# Patient Record
Sex: Female | Born: 1955 | Race: Black or African American | Hispanic: No | Marital: Single | Smoking: Never smoker
Health system: Southern US, Community
[De-identification: ages and names within clinical notes are randomized; demographics above are authoritative.]

## PROBLEM LIST (undated history)

## (undated) DIAGNOSIS — Z9889 Other specified postprocedural states: Secondary | ICD-10-CM

## (undated) DIAGNOSIS — T7840XA Allergy, unspecified, initial encounter: Secondary | ICD-10-CM

## (undated) DIAGNOSIS — R112 Nausea with vomiting, unspecified: Secondary | ICD-10-CM

## (undated) DIAGNOSIS — D259 Leiomyoma of uterus, unspecified: Secondary | ICD-10-CM

## (undated) DIAGNOSIS — M719 Bursopathy, unspecified: Secondary | ICD-10-CM

## (undated) DIAGNOSIS — M1990 Unspecified osteoarthritis, unspecified site: Secondary | ICD-10-CM

## (undated) DIAGNOSIS — E785 Hyperlipidemia, unspecified: Secondary | ICD-10-CM

## (undated) DIAGNOSIS — I10 Essential (primary) hypertension: Secondary | ICD-10-CM

## (undated) HISTORY — PX: BREAST CYST EXCISION: SHX579

## (undated) HISTORY — DX: Other specified postprocedural states: Z98.890

## (undated) HISTORY — DX: Essential (primary) hypertension: I10

## (undated) HISTORY — DX: Unspecified osteoarthritis, unspecified site: M19.90

## (undated) HISTORY — DX: Allergy, unspecified, initial encounter: T78.40XA

## (undated) HISTORY — DX: Nausea with vomiting, unspecified: R11.2

## (undated) HISTORY — DX: Hyperlipidemia, unspecified: E78.5

## (undated) HISTORY — DX: Leiomyoma of uterus, unspecified: D25.9

---

## 1997-07-19 ENCOUNTER — Ambulatory Visit (HOSPITAL_COMMUNITY): Admission: RE | Admit: 1997-07-19 | Discharge: 1997-07-19 | Payer: Self-pay | Admitting: Obstetrics and Gynecology

## 1997-08-08 ENCOUNTER — Ambulatory Visit (HOSPITAL_COMMUNITY): Admission: RE | Admit: 1997-08-08 | Discharge: 1997-08-08 | Payer: Self-pay | Admitting: Obstetrics and Gynecology

## 1998-09-19 ENCOUNTER — Other Ambulatory Visit: Admission: RE | Admit: 1998-09-19 | Discharge: 1998-09-19 | Payer: Self-pay | Admitting: Obstetrics and Gynecology

## 1998-10-31 ENCOUNTER — Encounter: Payer: Self-pay | Admitting: Obstetrics and Gynecology

## 1998-10-31 ENCOUNTER — Ambulatory Visit (HOSPITAL_COMMUNITY): Admission: RE | Admit: 1998-10-31 | Discharge: 1998-10-31 | Payer: Self-pay | Admitting: Obstetrics and Gynecology

## 2000-01-06 HISTORY — PX: KNEE SURGERY: SHX244

## 2000-03-18 ENCOUNTER — Ambulatory Visit (HOSPITAL_COMMUNITY): Admission: RE | Admit: 2000-03-18 | Discharge: 2000-03-18 | Payer: Self-pay | Admitting: Obstetrics and Gynecology

## 2000-03-18 ENCOUNTER — Encounter: Payer: Self-pay | Admitting: Obstetrics and Gynecology

## 2000-04-29 ENCOUNTER — Other Ambulatory Visit: Admission: RE | Admit: 2000-04-29 | Discharge: 2000-04-29 | Payer: Self-pay | Admitting: Obstetrics and Gynecology

## 2000-06-29 ENCOUNTER — Encounter: Payer: Self-pay | Admitting: Obstetrics and Gynecology

## 2000-06-29 ENCOUNTER — Ambulatory Visit (HOSPITAL_COMMUNITY): Admission: RE | Admit: 2000-06-29 | Discharge: 2000-06-29 | Payer: Self-pay | Admitting: Obstetrics and Gynecology

## 2001-04-27 ENCOUNTER — Encounter: Payer: Self-pay | Admitting: Obstetrics and Gynecology

## 2001-04-27 ENCOUNTER — Ambulatory Visit (HOSPITAL_COMMUNITY): Admission: RE | Admit: 2001-04-27 | Discharge: 2001-04-27 | Payer: Self-pay | Admitting: Obstetrics and Gynecology

## 2002-05-24 ENCOUNTER — Ambulatory Visit (HOSPITAL_COMMUNITY): Admission: RE | Admit: 2002-05-24 | Discharge: 2002-05-24 | Payer: Self-pay | Admitting: Obstetrics and Gynecology

## 2002-05-24 ENCOUNTER — Encounter: Payer: Self-pay | Admitting: Obstetrics and Gynecology

## 2003-01-06 HISTORY — PX: EYE SURGERY: SHX253

## 2003-06-05 ENCOUNTER — Ambulatory Visit (HOSPITAL_COMMUNITY): Admission: RE | Admit: 2003-06-05 | Discharge: 2003-06-05 | Payer: Self-pay | Admitting: Obstetrics and Gynecology

## 2003-12-25 ENCOUNTER — Ambulatory Visit: Payer: Self-pay | Admitting: Family Medicine

## 2004-05-07 ENCOUNTER — Ambulatory Visit: Payer: Self-pay | Admitting: Family Medicine

## 2004-06-17 ENCOUNTER — Ambulatory Visit (HOSPITAL_COMMUNITY): Admission: RE | Admit: 2004-06-17 | Discharge: 2004-06-17 | Payer: Self-pay | Admitting: Obstetrics and Gynecology

## 2004-09-05 ENCOUNTER — Ambulatory Visit: Payer: Self-pay | Admitting: Family Medicine

## 2005-06-18 ENCOUNTER — Ambulatory Visit (HOSPITAL_COMMUNITY): Admission: RE | Admit: 2005-06-18 | Discharge: 2005-06-18 | Payer: Self-pay | Admitting: Obstetrics and Gynecology

## 2005-10-14 ENCOUNTER — Ambulatory Visit: Payer: Self-pay | Admitting: Family Medicine

## 2005-10-22 ENCOUNTER — Ambulatory Visit: Payer: Self-pay | Admitting: Family Medicine

## 2005-10-22 LAB — CONVERTED CEMR LAB
AST: 23 units/L (ref 0–37)
BUN: 11 mg/dL (ref 6–23)
Basophils Absolute: 0 10*3/uL (ref 0.0–0.1)
Basophils Relative: 0.5 % (ref 0.0–1.0)
Calcium: 9 mg/dL (ref 8.4–10.5)
Chloride: 108 meq/L (ref 96–112)
Cholesterol: 175 mg/dL (ref 0–200)
Creatinine, Ser: 0.8 mg/dL (ref 0.4–1.2)
GFR calc non Af Amer: 81 mL/min
Glomerular Filtration Rate, Af Am: 98 mL/min/{1.73_m2}
HDL: 46.8 mg/dL (ref >39.0)
Monocytes Relative: 6.3 % (ref 3.0–11.0)
Platelets: 287 10*3/uL (ref 150–400)
Potassium: 3.5 meq/L (ref 3.5–5.1)
RBC: 4.79 M/uL (ref 3.87–5.11)
RDW: 12.6 % (ref 11.5–14.6)
TSH: 1.59 microintl units/mL (ref 0.35–5.50)
Total Protein: 6.9 g/dL (ref 6.0–8.3)
WBC: 7 10*3/uL (ref 4.5–10.5)

## 2005-11-10 ENCOUNTER — Ambulatory Visit: Payer: Self-pay

## 2005-11-16 ENCOUNTER — Ambulatory Visit: Payer: Self-pay | Admitting: Family Medicine

## 2006-03-24 ENCOUNTER — Ambulatory Visit: Payer: Self-pay | Admitting: Family Medicine

## 2006-08-30 ENCOUNTER — Telehealth (INDEPENDENT_AMBULATORY_CARE_PROVIDER_SITE_OTHER): Payer: Self-pay | Admitting: *Deleted

## 2007-03-17 ENCOUNTER — Ambulatory Visit: Payer: Self-pay | Admitting: Family Medicine

## 2007-03-17 DIAGNOSIS — J309 Allergic rhinitis, unspecified: Secondary | ICD-10-CM | POA: Insufficient documentation

## 2007-03-17 DIAGNOSIS — I10 Essential (primary) hypertension: Secondary | ICD-10-CM

## 2007-03-17 LAB — CONVERTED CEMR LAB
Bilirubin Urine: NEGATIVE
Blood in Urine, dipstick: NEGATIVE
Ketones, urine, test strip: NEGATIVE
Nitrite: NEGATIVE
Protein, U semiquant: NEGATIVE
Specific Gravity, Urine: 1.015
WBC Urine, dipstick: NEGATIVE

## 2007-03-22 ENCOUNTER — Encounter: Payer: Self-pay | Admitting: Family Medicine

## 2007-03-23 ENCOUNTER — Encounter (INDEPENDENT_AMBULATORY_CARE_PROVIDER_SITE_OTHER): Payer: Self-pay | Admitting: *Deleted

## 2007-03-23 LAB — CONVERTED CEMR LAB
AST: 21 units/L (ref 0–37)
BUN: 14 mg/dL (ref 6–23)
Basophils Relative: 1 % (ref 0.0–1.0)
Chloride: 105 meq/L (ref 96–112)
Eosinophils Absolute: 0.2 10*3/uL (ref 0.0–0.6)
Eosinophils Relative: 2.4 % (ref 0.0–5.0)
GFR calc non Af Amer: 95 mL/min
MCV: 87.5 fL (ref 78.0–100.0)
Monocytes Absolute: 0.5 10*3/uL (ref 0.2–0.7)
Neutrophils Relative %: 64.3 % (ref 43.0–77.0)
Platelets: 305 10*3/uL (ref 150–400)
Potassium: 3.8 meq/L (ref 3.5–5.1)
RBC: 4.86 M/uL (ref 3.87–5.11)
Sodium: 141 meq/L (ref 135–145)
TSH: 1.21 microintl units/mL (ref 0.35–5.50)
Total Bilirubin: 0.8 mg/dL (ref 0.3–1.2)

## 2007-03-30 ENCOUNTER — Telehealth (INDEPENDENT_AMBULATORY_CARE_PROVIDER_SITE_OTHER): Payer: Self-pay | Admitting: *Deleted

## 2007-04-11 ENCOUNTER — Telehealth (INDEPENDENT_AMBULATORY_CARE_PROVIDER_SITE_OTHER): Payer: Self-pay | Admitting: *Deleted

## 2007-08-05 ENCOUNTER — Ambulatory Visit: Payer: Self-pay | Admitting: Family Medicine

## 2007-08-05 DIAGNOSIS — E785 Hyperlipidemia, unspecified: Secondary | ICD-10-CM | POA: Insufficient documentation

## 2007-08-05 DIAGNOSIS — IMO0001 Reserved for inherently not codable concepts without codable children: Secondary | ICD-10-CM | POA: Insufficient documentation

## 2007-08-11 ENCOUNTER — Telehealth (INDEPENDENT_AMBULATORY_CARE_PROVIDER_SITE_OTHER): Payer: Self-pay | Admitting: *Deleted

## 2007-08-11 LAB — CONVERTED CEMR LAB
ALT: 32 units/L (ref 0–35)
AST: 28 units/L (ref 0–37)
Bilirubin, Direct: 0.1 mg/dL (ref 0.0–0.3)
Total Bilirubin: 1 mg/dL (ref 0.3–1.2)

## 2007-09-09 ENCOUNTER — Ambulatory Visit (HOSPITAL_COMMUNITY): Admission: RE | Admit: 2007-09-09 | Discharge: 2007-09-09 | Payer: Self-pay | Admitting: Obstetrics and Gynecology

## 2007-10-27 ENCOUNTER — Ambulatory Visit: Payer: Self-pay | Admitting: Family Medicine

## 2008-04-13 ENCOUNTER — Telehealth (INDEPENDENT_AMBULATORY_CARE_PROVIDER_SITE_OTHER): Payer: Self-pay | Admitting: *Deleted

## 2008-04-23 ENCOUNTER — Ambulatory Visit: Payer: Self-pay | Admitting: Family Medicine

## 2008-04-23 LAB — CONVERTED CEMR LAB
Glucose, Urine, Semiquant: NEGATIVE
Ketones, urine, test strip: NEGATIVE
Protein, U semiquant: NEGATIVE
Specific Gravity, Urine: 1.025
WBC Urine, dipstick: NEGATIVE

## 2008-04-24 ENCOUNTER — Encounter: Payer: Self-pay | Admitting: Family Medicine

## 2008-04-26 LAB — CONVERTED CEMR LAB
AST: 23 units/L (ref 0–37)
Albumin: 3.9 g/dL (ref 3.5–5.2)
Calcium: 9 mg/dL (ref 8.4–10.5)
Creatinine, Ser: 0.7 mg/dL (ref 0.4–1.2)
Eosinophils Relative: 5 % (ref 0.0–5.0)
Glucose, Bld: 86 mg/dL (ref 70–99)
Lymphs Abs: 2.2 10*3/uL (ref 0.7–4.0)
MCHC: 34 g/dL (ref 30.0–36.0)
Monocytes Absolute: 0.5 10*3/uL (ref 0.1–1.0)
Neutro Abs: 5.3 10*3/uL (ref 1.4–7.7)
Neutrophils Relative %: 62.2 % (ref 43.0–77.0)
RBC: 4.98 M/uL (ref 3.87–5.11)
RDW: 12.3 % (ref 11.5–14.6)
Sodium: 143 meq/L (ref 135–145)
Total Bilirubin: 0.7 mg/dL (ref 0.3–1.2)
Total Protein: 7.4 g/dL (ref 6.0–8.3)

## 2008-04-27 ENCOUNTER — Encounter (INDEPENDENT_AMBULATORY_CARE_PROVIDER_SITE_OTHER): Payer: Self-pay | Admitting: *Deleted

## 2008-05-01 ENCOUNTER — Encounter (INDEPENDENT_AMBULATORY_CARE_PROVIDER_SITE_OTHER): Payer: Self-pay | Admitting: *Deleted

## 2008-05-29 ENCOUNTER — Encounter: Payer: Self-pay | Admitting: Family Medicine

## 2008-06-06 ENCOUNTER — Ambulatory Visit: Payer: Self-pay | Admitting: Gastroenterology

## 2008-06-20 ENCOUNTER — Ambulatory Visit: Payer: Self-pay | Admitting: Gastroenterology

## 2008-07-16 ENCOUNTER — Telehealth (INDEPENDENT_AMBULATORY_CARE_PROVIDER_SITE_OTHER): Payer: Self-pay | Admitting: *Deleted

## 2008-07-18 ENCOUNTER — Telehealth (INDEPENDENT_AMBULATORY_CARE_PROVIDER_SITE_OTHER): Payer: Self-pay | Admitting: *Deleted

## 2008-09-14 ENCOUNTER — Ambulatory Visit (HOSPITAL_COMMUNITY): Admission: RE | Admit: 2008-09-14 | Discharge: 2008-09-14 | Payer: Self-pay | Admitting: Obstetrics and Gynecology

## 2008-09-26 ENCOUNTER — Ambulatory Visit: Payer: Self-pay | Admitting: Family Medicine

## 2008-09-26 DIAGNOSIS — R112 Nausea with vomiting, unspecified: Secondary | ICD-10-CM

## 2008-09-26 DIAGNOSIS — R42 Dizziness and giddiness: Secondary | ICD-10-CM | POA: Insufficient documentation

## 2008-09-26 LAB — CONVERTED CEMR LAB
Bilirubin Urine: NEGATIVE
Nitrite: NEGATIVE
Urobilinogen, UA: NEGATIVE
pH: 6

## 2008-10-01 ENCOUNTER — Encounter: Payer: Self-pay | Admitting: Family Medicine

## 2008-10-01 LAB — CONVERTED CEMR LAB
ALT: 23 units/L (ref 0–35)
AST: 26 units/L (ref 0–37)
BUN: 12 mg/dL (ref 6–23)
Basophils Absolute: 0 10*3/uL (ref 0.0–0.1)
Basophils Relative: 0.2 % (ref 0.0–3.0)
Bilirubin, Direct: 0 mg/dL (ref 0.0–0.3)
Calcium: 9.2 mg/dL (ref 8.4–10.5)
Chloride: 107 meq/L (ref 96–112)
Eosinophils Relative: 2.5 % (ref 0.0–5.0)
GFR calc non Af Amer: 97.07 mL/min (ref >60)
Hemoglobin: 14.6 g/dL (ref 12.0–15.0)
Lymphocytes Relative: 11.8 % — ABNORMAL LOW (ref 12.0–46.0)
MCV: 89.3 fL (ref 78.0–100.0)
Monocytes Absolute: 0.5 10*3/uL (ref 0.1–1.0)
Neutro Abs: 7.1 10*3/uL (ref 1.4–7.7)
Neutrophils Relative %: 79.4 % — ABNORMAL HIGH (ref 43.0–77.0)
Platelets: 260 10*3/uL (ref 150.0–400.0)
Sodium: 142 meq/L (ref 135–145)
TSH: 0.93 microintl units/mL (ref 0.35–5.50)
Total Bilirubin: 0.6 mg/dL (ref 0.3–1.2)
WBC: 8.8 10*3/uL (ref 4.5–10.5)

## 2008-11-02 ENCOUNTER — Telehealth (INDEPENDENT_AMBULATORY_CARE_PROVIDER_SITE_OTHER): Payer: Self-pay | Admitting: *Deleted

## 2008-11-23 ENCOUNTER — Ambulatory Visit: Payer: Self-pay | Admitting: Family Medicine

## 2009-02-01 ENCOUNTER — Telehealth (INDEPENDENT_AMBULATORY_CARE_PROVIDER_SITE_OTHER): Payer: Self-pay | Admitting: *Deleted

## 2009-04-12 ENCOUNTER — Telehealth: Payer: Self-pay | Admitting: Family Medicine

## 2009-05-15 ENCOUNTER — Ambulatory Visit: Payer: Self-pay | Admitting: Family Medicine

## 2009-05-15 ENCOUNTER — Encounter (INDEPENDENT_AMBULATORY_CARE_PROVIDER_SITE_OTHER): Payer: Self-pay | Admitting: *Deleted

## 2009-05-15 DIAGNOSIS — M79609 Pain in unspecified limb: Secondary | ICD-10-CM | POA: Insufficient documentation

## 2009-07-29 ENCOUNTER — Telehealth: Payer: Self-pay | Admitting: Family Medicine

## 2009-08-16 ENCOUNTER — Encounter (INDEPENDENT_AMBULATORY_CARE_PROVIDER_SITE_OTHER): Payer: Self-pay | Admitting: *Deleted

## 2009-08-21 ENCOUNTER — Encounter: Payer: Self-pay | Admitting: Family Medicine

## 2009-09-10 ENCOUNTER — Ambulatory Visit: Payer: Self-pay | Admitting: Family Medicine

## 2009-09-11 ENCOUNTER — Encounter: Payer: Self-pay | Admitting: Family Medicine

## 2009-09-12 ENCOUNTER — Ambulatory Visit: Payer: Self-pay | Admitting: Family Medicine

## 2009-09-12 LAB — CONVERTED CEMR LAB
Bilirubin Urine: NEGATIVE
Blood in Urine, dipstick: NEGATIVE
Ketones, urine, test strip: NEGATIVE
Nitrite: NEGATIVE

## 2009-09-13 LAB — CONVERTED CEMR LAB
ALT: 22 units/L (ref 0–35)
Albumin: 4 g/dL (ref 3.5–5.2)
Basophils Absolute: 0 10*3/uL (ref 0.0–0.1)
Bilirubin, Direct: 0.1 mg/dL (ref 0.0–0.3)
CO2: 26 meq/L (ref 19–32)
Chloride: 106 meq/L (ref 96–112)
Cholesterol: 149 mg/dL (ref 0–200)
Creatinine, Ser: 0.7 mg/dL (ref 0.4–1.2)
Eosinophils Absolute: 0.4 10*3/uL (ref 0.0–0.7)
Eosinophils Relative: 4.8 % (ref 0.0–5.0)
GFR calc non Af Amer: 114.7 mL/min (ref >60)
Glucose, Bld: 80 mg/dL (ref 70–99)
HCT: 40.1 % (ref 36.0–46.0)
Hemoglobin: 13.9 g/dL (ref 12.0–15.0)
Monocytes Relative: 6.4 % (ref 3.0–12.0)
Neutro Abs: 5.4 10*3/uL (ref 1.4–7.7)
Potassium: 4.1 meq/L (ref 3.5–5.1)
Sodium: 140 meq/L (ref 135–145)
TSH: 1.51 microintl units/mL (ref 0.35–5.50)
Total Protein: 6.7 g/dL (ref 6.0–8.3)
Triglycerides: 38 mg/dL (ref 0.0–149.0)
VLDL: 7.6 mg/dL (ref 0.0–40.0)
WBC: 8.5 10*3/uL (ref 4.5–10.5)

## 2009-09-16 ENCOUNTER — Ambulatory Visit (HOSPITAL_COMMUNITY): Admission: RE | Admit: 2009-09-16 | Discharge: 2009-09-16 | Payer: Self-pay | Admitting: Obstetrics and Gynecology

## 2009-09-24 ENCOUNTER — Encounter: Admission: RE | Admit: 2009-09-24 | Discharge: 2009-09-24 | Payer: Self-pay | Admitting: Obstetrics and Gynecology

## 2009-10-07 ENCOUNTER — Ambulatory Visit: Payer: Self-pay | Admitting: Family Medicine

## 2010-01-26 ENCOUNTER — Encounter: Payer: Self-pay | Admitting: Obstetrics and Gynecology

## 2010-02-04 NOTE — Assessment & Plan Note (Signed)
Summary: cpx/cbs   Vital Signs:  Patient profile:   55 year old female Height:      61.5 inches Weight:      148.6 pounds Temp:     98.3 degrees F oral Pulse rate:   76 / minute Pulse rhythm:   regular BP sitting:   132 / 90  (left arm)  Vitals Entered By: Almeta Monas CMA Duncan Dull) (September 10, 2009 1:18 PM) CC: cpx   History of Present Illness: Pt here for cpe. Pt sees gyn for pap.    Hyperlipidemia follow-up      This is a 55 year old woman who presents for Hyperlipidemia follow-up.  The patient denies muscle aches, GI upset, abdominal pain, flushing, itching, constipation, diarrhea, and fatigue.  The patient denies the following symptoms: chest pain/pressure, exercise intolerance, dypsnea, palpitations, syncope, and pedal edema.  Compliance with medications (by patient report) has been near 100%.  Dietary compliance has been good.  The patient reports exercising 3-4X per week.  Adjunctive measures currently used by the patient include ASA.    Hypertension follow-up      The patient also presents for Hypertension follow-up.  The patient denies lightheadedness, urinary frequency, headaches, edema, impotence, rash, and fatigue.  The patient denies the following associated symptoms: chest pain, chest pressure, exercise intolerance, dyspnea, palpitations, syncope, leg edema, and pedal edema.  Compliance with medications (by patient report) has been near 100%.  The patient reports that dietary compliance has been good.  The patient reports exercising 3-4X per week.  Adjunctive measures currently used by the patient include salt restriction.    Preventive Screening-Counseling & Management  Alcohol-Tobacco     Alcohol drinks/day: <1     Smoking Status: never  Caffeine-Diet-Exercise     Caffeine use/day: 3 or more     Does Patient Exercise: yes     Type of exercise: WALKING,   Gym     Times/week: 3-4  Hep-HIV-STD-Contraception     Hepatitis Risk: no risk noted     Hepatitis Risk  Counseling: not indicated-no hepatitis risk noted     HIV Risk: no risk noted     HIV Risk Counseling: not indicated-no HIV risk noted     STD Risk: no risk noted     STD Risk Counseling: not indicated-no STD risk noted     Contraception Counseling: not applicable     Dental Visit-last 6 months yes     Dental Care Counseling: not indicated; dental care within six months     SBE monthly: yes     SBE Education/Counseling: not indicated; SBE done regularly  Safety-Violence-Falls     Seat Belt Use: yes     Seat Belt Counseling: not indicated; patient wears seat belts     Firearms in the Home: no firearms in the home     Smoke Detectors: yes     Violence in the Home: no risk noted     Sexual Abuse: no      Sexual History:  currently monogamous.        Drug Use:  never.    Current Medications (verified): 1)  Norvasc 5 Mg  Tabs (Amlodipine Besylate) .Marland Kitchen.. 1 By Mouth Once Daily 2)  Celebrex 200 Mg  Caps (Celecoxib) .Marland Kitchen.. 1 By Mouth Qd 3)  Prempro 0.3-1.5 Mg  Tabs (Conj Estrog-Medroxyprogest Ace) .Marland Kitchen.. 1 By Mouth Once Daily 4)  Nasonex 50 Mcg/act  Susp (Mometasone Furoate) .... 2 Sprays Each Nostril Once Daily 5)  Pravachol  40 Mg  Tabs (Pravastatin Sodium) .Marland Kitchen.. 1 By Mouth At Bedtime**lab Due Now** 6)  Ultram 50 Mg Tabs (Tramadol Hcl) .Marland Kitchen.. 1-2 By Mouth Every 6 Hours As Needed 7)  Diclofenac Sodium 50 Mg Tbec (Diclofenac Sodium) .Marland Kitchen.. 1 By Mouth Two Times A Day With Food 8)  Zyrtec Hives Relief 10 Mg Tabs (Cetirizine Hcl) .Marland Kitchen.. 1 By Mouth As Needed  Allergies (verified): No Known Drug Allergies  Past History:  Past Medical History: Last updated: 08/05/2007 Hypertension Allergic rhinitis Hyperlipidemia  Past Surgical History: Last updated: 03/17/2007 RIGHT EYE SURGERY-2005 KNEE SURGERY-BOTH 2002 CYST REMOVED FROM RIGHT BREAST  Family History: Last updated: 03/17/2007 MOTHER: LIVING FATHER: DECEASED 2 BROTHERS: LIVING STOMACH CANCER: FATHER Family History of Hypertension:  MOTHER Family History of Alcoholism: FATHER Family History of Diabetes: MOTHER, AUNTS-MOTHER'S SIDE  Social History: Last updated: 03/17/2007 Patient has never smoked.  Passive smoke exposure - no Alcohol Use - no Drug Use - no HIV High Risk Behavior - no Patient gets regular exercise. Occupation--electronics/machines  Risk Factors: Alcohol Use: <1 (09/10/2009) >5 drinks/d w/in last 3 months: no (04/23/2008) Caffeine Use: 3 or more (09/10/2009) Exercise: yes (09/10/2009)  Risk Factors: Smoking Status: never (09/10/2009) Passive Smoke Exposure: no (03/17/2007)  Family History: Reviewed history from 03/17/2007 and no changes required. MOTHER: LIVING FATHER: DECEASED 2 BROTHERS: LIVING STOMACH CANCER: FATHER Family History of Hypertension: MOTHER Family History of Alcoholism: FATHER Family History of Diabetes: MOTHER, AUNTS-MOTHER'S SIDE  Social History: Reviewed history from 03/17/2007 and no changes required. Patient has never smoked.  Passive smoke exposure - no Alcohol Use - no Drug Use - no HIV High Risk Behavior - no Patient gets regular exercise. Occupation--electronics/machinesDoes Patient Exercise:  yes Sexual History:  currently monogamous  Review of Systems      See HPI General:  Denies chills, fatigue, fever, loss of appetite, malaise, sleep disorder, sweats, weakness, and weight loss. Eyes:  Denies blurring, discharge, double vision, eye irritation, eye pain, halos, itching, light sensitivity, red eye, vision loss-1 eye, and vision loss-both eyes; optho q1y. ENT:  Denies decreased hearing, difficulty swallowing, ear discharge, earache, hoarseness, nasal congestion, nosebleeds, postnasal drainage, ringing in ears, sinus pressure, and sore throat. CV:  Denies bluish discoloration of lips or nails, chest pain or discomfort, difficulty breathing at night, difficulty breathing while lying down, fainting, fatigue, leg cramps with exertion, lightheadness, near  fainting, palpitations, shortness of breath with exertion, swelling of feet, swelling of hands, and weight gain. Resp:  Denies chest discomfort, chest pain with inspiration, cough, coughing up blood, excessive snoring, hypersomnolence, morning headaches, pleuritic, shortness of breath, sputum productive, and wheezing. GI:  Denies abdominal pain, bloody stools, change in bowel habits, constipation, dark tarry stools, diarrhea, excessive appetite, gas, hemorrhoids, indigestion, loss of appetite, nausea, vomiting, vomiting blood, and yellowish skin color. GU:  Denies abnormal vaginal bleeding, decreased libido, discharge, dysuria, genital sores, hematuria, incontinence, nocturia, urinary frequency, and urinary hesitancy. MS:  Denies joint pain, joint redness, joint swelling, loss of strength, low back pain, mid back pain, muscle aches, muscle , cramps, muscle weakness, stiffness, and thoracic pain. Derm:  Denies changes in color of skin, changes in nail beds, dryness, excessive perspiration, flushing, hair loss, insect bite(s), itching, lesion(s), poor wound healing, and rash. Neuro:  Denies brief paralysis, difficulty with concentration, disturbances in coordination, falling down, headaches, inability to speak, memory loss, numbness, poor balance, seizures, sensation of room spinning, tingling, tremors, visual disturbances, and weakness. Psych:  Denies alternate hallucination ( auditory/visual), anxiety, depression, easily angered,  easily tearful, irritability, mental problems, panic attacks, sense of great danger, suicidal thoughts/plans, thoughts of violence, unusual visions or sounds, and thoughts /plans of harming others. Endo:  Denies cold intolerance, excessive hunger, excessive thirst, excessive urination, heat intolerance, polyuria, and weight change. Heme:  Denies abnormal bruising, bleeding, enlarge lymph nodes, fevers, pallor, and skin discoloration. Allergy:  Denies hives or rash, itching eyes,  persistent infections, seasonal allergies, and sneezing.  Physical Exam  General:  Well-developed,well-nourished,in no acute distress; alert,appropriate and cooperative throughout examination Head:  Normocephalic and atraumatic without obvious abnormalities. No apparent alopecia or balding. Eyes:  vision grossly intact, pupils equal, pupils round, pupils reactive to light, and no injection.   Ears:  External ear exam shows no significant lesions or deformities.  Otoscopic examination reveals clear canals, tympanic membranes are intact bilaterally without bulging, retraction, inflammation or discharge. Hearing is grossly normal bilaterally. Nose:  External nasal examination shows no deformity or inflammation. Nasal mucosa are pink and moist without lesions or exudates. Mouth:  Oral mucosa and oropharynx without lesions or exudates.  Teeth in good repair. Neck:  No deformities, masses, or tenderness noted. Chest Wall:  No deformities, masses, or tenderness noted. Breasts:  gyn Lungs:  Normal respiratory effort, chest expands symmetrically. Lungs are clear to auscultation, no crackles or wheezes. Heart:  normal rate and no murmur.   Abdomen:  Bowel sounds positive,abdomen soft and non-tender without masses, organomegaly or hernias noted. Rectal:  gyn Genitalia:  gyn Msk:  normal ROM, no joint tenderness, no joint swelling, no joint warmth, no redness over joints, no joint deformities, no joint instability, and no crepitation.   Pulses:  R and L carotid,radial,femoral,dorsalis pedis and posterior tibial pulses are full and equal bilaterally Extremities:  No clubbing, cyanosis, edema, or deformity noted with normal full range of motion of all joints.   Neurologic:  No cranial nerve deficits noted. Station and gait are normal. Plantar reflexes are down-going bilaterally. DTRs are symmetrical throughout. Sensory, motor and coordinative functions appear intact. Skin:  Intact without suspicious lesions  or rashes Cervical Nodes:  No lymphadenopathy noted Axillary Nodes:  No palpable lymphadenopathy Psych:  Cognition and judgment appear intact. Alert and cooperative with normal attention span and concentration. No apparent delusions, illusions, hallucinations   Impression & Recommendations:  Problem # 1:  PREVENTIVE HEALTH CARE (ICD-V70.0) check fasting labs ghm utd  Orders: EKG w/ Interpretation (93000)  Problem # 2:  HYPERLIPIDEMIA (ICD-272.4)  Her updated medication list for this problem includes:    Pravachol 40 Mg Tabs (Pravastatin sodium) .Marland Kitchen... 1 by mouth at bedtime**lab due now**  Labs Reviewed: SGOT: 26 (09/26/2008)   SGPT: 23 (09/26/2008)   HDL:46.8 (10/22/2005)  LDL:116 (10/22/2005)  Chol:175 (10/22/2005)  Trig:63 (10/22/2005)  Orders: EKG w/ Interpretation (93000)  Problem # 3:  HYPERTENSION (ICD-401.9)  Her updated medication list for this problem includes:    Norvasc 5 Mg Tabs (Amlodipine besylate) .Marland Kitchen... 1 by mouth once daily  BP today: 132/90 Prior BP: 130/84 (05/15/2009)  Labs Reviewed: K+: 3.6 (09/26/2008) Creat: : 0.8 (09/26/2008)   Chol: 175 (10/22/2005)   HDL: 46.8 (10/22/2005)   LDL: 116 (10/22/2005)   TG: 63 (10/22/2005)  Orders: EKG w/ Interpretation (93000)  Complete Medication List: 1)  Norvasc 5 Mg Tabs (Amlodipine besylate) .Marland Kitchen.. 1 by mouth once daily 2)  Celebrex 200 Mg Caps (Celecoxib) .Marland Kitchen.. 1 by mouth qd 3)  Prempro 0.3-1.5 Mg Tabs (Conj estrog-medroxyprogest ace) .Marland Kitchen.. 1 by mouth once daily 4)  Nasonex 50 Mcg/act Susp (  Mometasone furoate) .... 2 sprays each nostril once daily 5)  Pravachol 40 Mg Tabs (Pravastatin sodium) .Marland Kitchen.. 1 by mouth at bedtime**lab due now** 6)  Ultram 50 Mg Tabs (Tramadol hcl) .Marland Kitchen.. 1-2 by mouth every 6 hours as needed 7)  Diclofenac Sodium 50 Mg Tbec (Diclofenac sodium) .Marland Kitchen.. 1 by mouth two times a day with food 8)  Zyrtec Hives Relief 10 Mg Tabs (Cetirizine hcl) .Marland Kitchen.. 1 by mouth as needed  Patient Instructions: 1)   V70.0  401.9  cbcd, bmp, hep, tsh., lipid, UA Prescriptions: NORVASC 5 MG  TABS (AMLODIPINE BESYLATE) 1 by mouth once daily Brand medically necessary #90 x 3   Entered and Authorized by:   Loreen Freud DO   Signed by:   Loreen Freud DO on 09/10/2009   Method used:   Print then Give to Patient   RxID:   [id number] PRAVACHOL 40 MG  TABS (PRAVASTATIN SODIUM) 1 by mouth at bedtime**LAB DUE NOW**  #30 x 5   Entered and Authorized by:   Loreen Freud DO   Signed by:   Loreen Freud DO on 09/10/2009   Method used:   [facility] to        [id number]       [address]       [place], [place]  [address]       Ph: [phone] or [phone]       Fax: [phone]   RxID:   [id number] NORVASC 5 MG  TABS (AMLODIPINE BESYLATE) 1 by mouth once daily Brand medically necessary #30 x 5   Entered and Authorized by:   Loreen Freud DO   Signed by:   Loreen Freud DO on 09/10/2009   Method used:   [facility]        [id number]       [address]       [place], [place]  [address]       Ph: [phone] or [phone]       Fax: [phone]   RxID:   [id number] CELEBREX 200 MG  CAPS (CELECOXIB) 1 by mouth qd  #30.0 Each x 5   Entered and Authorized by:   Loreen Freud DO   Signed by:   Loreen Freud DO on 09/10/2009   Method used:   [facility]        [id number]       [address]       [place], [place]  [address]       Ph: [phone] or [phone]       Fax: [phone]   RxID:   [id number]    EKG  Procedure date:  09/10/2009  Findings:      Normal sinus rhythm with rate of:  64bpm

## 2010-02-04 NOTE — Progress Notes (Signed)
Summary: Refill  Phone Note Refill Request   Refills Requested: Medication #1:  CELEBREX 200 MG  CAPS 1 by mouth qd   Last Refilled: 10/2008 last ov- 09/2008. pharm- [facility] [facility]   Follow-up for Phone Call        ok to refill x1 Follow-up by: Loreen Freud DO,  April 12, 2009 1:12 PM    Prescriptions: CELEBREX 200 MG  CAPS (CELECOXIB) 1 by mouth qd  #30.0 Each x 0   Entered by:   Army Fossa CMA   Authorized by:   Loreen Freud DO   Signed by:   Army Fossa CMA on 04/12/2009   Method used:   [facility]        [id number]       [address]       [place], [place]  [address]       Ph: [phone] or [phone]       Fax: [phone]   RxID:   [id number]

## 2010-02-04 NOTE — Assessment & Plan Note (Signed)
Summary: flu shot/cbs   Nurse Visit   Allergies: No Known Drug Allergies  Orders Added: 1)  Admin 1st Vaccine [90471] 2)  Flu Vaccine 3yrs + [90658] Flu Vaccine Consent Questions     Do you have a history of severe allergic reactions to this vaccine? no    Any prior history of allergic reactions to egg and/or gelatin? no    Do you have a sensitivity to the preservative Thimersol? no    Do you have a past history of Guillan-Barre Syndrome? no    Do you currently have an acute febrile illness? no    Have you ever had a severe reaction to latex? no    Vaccine information given and explained to patient? yes    Are you currently pregnant? no    Lot Number:[id number]   Exp Date:07/05/2010   Site Given  Left Deltoid IM .lbflu 

## 2010-02-04 NOTE — Medication Information (Signed)
Summary: Care Consideration Regarding Lipid Lowering Therapy/[facility]  Care Consideration Regarding Lipid Lowering Therapy/[facility]   Imported By: Lanelle Bal 09/12/2009 13:17:50  _____________________________________________________________________  External Attachment:    Type:   Image     Comment:   External Document

## 2010-02-04 NOTE — Progress Notes (Signed)
Summary: letter for ins   Phone Note Call from Patient   Caller: Patient Call For: Loreen Freud DO Summary of Call: patient said cost  for norvasc has gone up - her ins wants her to take generic but she has tried generic before It causes fluid - she needs a letter stating she can only take brand name  - fax [phone]  attn  research & correspondance i Initial call taken by: Okey Regal [place],  July 29, 2009 1:54 PM  Follow-up for Phone Call        pls advise..........Marland KitchenFelecia Deloach CMA  July 29, 2009 4:41 PM   Additional Follow-up for Phone Call Additional follow up Details #1::        RX with BMN printed Additional Follow-up by: Loreen Freud DO,  July 29, 2009 4:44 PM    Additional Follow-up for Phone Call Additional follow up Details #2::    FAXED...Marland KitchenMarland KitchenFelecia Deloach CMA  July 29, 2009 5:42 PM   New/Updated Medications: NORVASC 5 MG  TABS (AMLODIPINE BESYLATE) 1 by mouth once daily [BMN] Prescriptions: NORVASC 5 MG  TABS (AMLODIPINE BESYLATE) 1 by mouth once daily Brand medically necessary #30 x 3   Entered and Authorized by:   Loreen Freud DO   Signed by:   Loreen Freud DO on 07/29/2009   Method used:   Printed then faxed to ...       [id number]* (retail)       [address] [place], [place]  [address]       Ph: [phone]       Fax: [phone]   RxID:   [id number]

## 2010-02-04 NOTE — Progress Notes (Signed)
Summary: refill  Phone Note Refill Request Message from:  Fax from Pharmacy [facility] [address] fax [phone]  amlodipine besylate 5mg   Initial call taken by: Barb Merino,  February 01, 2009 3:52 PM    Prescriptions: NORVASC 5 MG  TABS (AMLODIPINE BESYLATE) 1 by mouth once daily Brand medically necessary #30 x 1   Entered by:   Army Fossa CMA   Authorized by:   Loreen Freud DO   Signed by:   Army Fossa CMA on 02/01/2009   Method used:   [facility]        [id number]       [address]       [place], [place]  [address]       Ph: [phone] or [phone]       Fax: [phone]   RxID:   [id number]

## 2010-02-04 NOTE — Letter (Signed)
Summary: Out of Work  [facility]  [address] [place], [place] [address]   Phone: [phone]  Fax: [phone]    May 15, 2009   Employee:  CAREEN MAUCH Derhammer    To Whom It May Concern:   For Medical reasons, please excuse the above named employee from work for the following dates:  Start:   May 13, 2009  End:   May 15, 2009  If you need additional information, please feel free to contact our office.         Sincerely,    Loreen Freud, DO

## 2010-02-04 NOTE — Consult Note (Signed)
Summary: [facility]  [facility]   Imported By: Lanelle Bal 05/23/2009 13:27:51  _____________________________________________________________________  External Attachment:    Type:   Image     Comment:   External Document

## 2010-02-04 NOTE — Assessment & Plan Note (Signed)
Summary: pain in hand/cbs   Vital Signs:  Patient profile:   55 year old female Height:      61 inches Weight:      147 pounds BMI:     27.88 Pulse rate:   76 / minute BP sitting:   130 / 84  (left arm) Cuff size:   regular  Vitals Entered By: Army Fossa CMA (May 15, 2009 11:09 AM) CC: Pt here pain in both hands x 2-3 weeks.    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  She uses both hands repetitively.  Current Medications (verified): 1)  Norvasc 5 Mg  Tabs (Amlodipine Besylate) .Marland Kitchen.. 1 By Mouth Once Daily 2)  Celebrex 200 Mg  Caps (Celecoxib) .Marland Kitchen.. 1 By Mouth Qd 3)  Prempro 0.3-1.5 Mg  Tabs (Conj Estrog-Medroxyprogest Ace) .Marland Kitchen.. 1 By Mouth Once Daily 4)  Nasonex 50 Mcg/act  Susp (Mometasone Furoate) .... 2 Sprays Each Nostril Once Daily 5)  Pravachol 40 Mg  Tabs (Pravastatin Sodium) .Marland Kitchen.. 1 By Mouth At Bedtime 6)  Promethazine Hcl 25 Mg Supp (Promethazine Hcl) .Marland Kitchen.. 1 Pr Qid As Needed 7)  Ultram 50 Mg Tabs (Tramadol Hcl) .Marland Kitchen.. 1-2 By Mouth Every 6 Hours As Needed  Allergies (verified): No Known Drug Allergies  Past History:  Past medical, surgical, family and social histories (including risk factors) reviewed for relevance to current acute and chronic problems.  Past Medical History: Reviewed history from 08/05/2007 and no changes required. Hypertension Allergic rhinitis Hyperlipidemia  Past Surgical History: Reviewed history from 03/17/2007 and no changes required. RIGHT EYE SURGERY-2005 KNEE SURGERY-BOTH 2002 CYST REMOVED FROM RIGHT BREAST  Family History: Reviewed history from 03/17/2007 and no changes required. MOTHER: LIVING FATHER: DECEASED 2 BROTHERS: LIVING STOMACH CANCER: FATHER Family History of Hypertension: MOTHER Family History of Alcoholism: FATHER Family History of Diabetes: MOTHER, AUNTS-MOTHER'S SIDE  Social History: Reviewed  history from 03/17/2007 and no changes required. Patient has never smoked.  Passive smoke exposure - no Alcohol Use - no Drug Use - no HIV High Risk Behavior - no Patient gets regular exercise. Occupation--electronics/machines  Review of Systems      See HPI  Physical Exam  General:  Well-developed,well-nourished,in no acute distress; alert,appropriate and cooperative throughout examination Neck:  No deformities, masses, or tenderness noted. Msk:  no joint warmth, no joint deformities, joint tenderness, and joint swelling in both hands over meta carpals. + numbness in fingers with flexion of wrists Psych:  Oriented X3 and normally interactive.     Impression & Recommendations:  Problem # 1:  HAND PAIN, BILATERAL (ICD-729.5) Use splints if you can  celebrex 200 mg once daily  Orders: Orthopedic Surgeon Referral (Ortho Surgeon)  Complete Medication List: 1)  Norvasc 5 Mg Tabs (Amlodipine besylate) .Marland Kitchen.. 1 by mouth once daily 2)  Celebrex 200 Mg Caps (Celecoxib) .Marland Kitchen.. 1 by mouth qd 3)  Prempro 0.3-1.5 Mg Tabs (Conj estrog-medroxyprogest ace) .Marland Kitchen.. 1 by mouth once daily 4)  Nasonex 50 Mcg/act Susp (Mometasone furoate) .... 2 sprays each nostril once daily 5)  Pravachol 40 Mg Tabs (Pravastatin sodium) .Marland Kitchen.. 1 by mouth at bedtime 6)  Promethazine Hcl 25 Mg Supp (Promethazine hcl) .Marland Kitchen.. 1 pr qid as needed 7)  Ultram 50 Mg Tabs (Tramadol hcl) .Marland Kitchen.. 1-2 by mouth every 6 hours as needed Prescriptions: ULTRAM 50 MG TABS (TRAMADOL HCL) 1-2 by mouth  every 6 hours as needed  #60 x 2   Entered and Authorized by:   Loreen Freud DO   Signed by:   Loreen Freud DO on 05/15/2009   Method used:   [facility]        [id number]       [address]       [place], [place]  [address]       Ph: [phone] or [phone]       Fax: [phone]   RxID:   [id number]

## 2010-02-04 NOTE — Letter (Signed)
Summary: Primary Care Appointment Letter  [facility]  [address] [place], [place] [address]   Phone: [phone]  Fax: [phone]    08/16/2009 MRN: [id number]  [facility] [address] [place], [place]  [address]  Dear Ms. Miron,   Your Primary Care Physician Loreen Freud DO has indicated that:    _x______it is time to schedule an appointment.    _______you missed your appointment on______ and need to call and          reschedule.    _______you need to have lab work done.    _______you need to schedule an appointment discuss lab or test results.    _______you need to call to reschedule your appointment that is                       scheduled on _________.     Please call our office as soon as possible. Our phone number is [phone]. Please press option 1. Our office is open 8a-12noon and 1p-5p, Monday through Friday.     Thank you,    [facility] Scheduler

## 2010-03-05 ENCOUNTER — Telehealth: Payer: Self-pay | Admitting: Family Medicine

## 2010-03-13 NOTE — Progress Notes (Signed)
Summary: Refill-Requesting Quantity Change  Phone Note Refill Request Call back at [phone] Message from:  Fax from Pharmacy on March 05, 2010 8:52 AM  Refills Requested: Medication #1:  PRAVACHOL 40 MG  TABS 1 by mouth at bedtime**LAB DUE NOW**   Last Refilled: 02/13/2010 Pharmacy is requesting a 90 day supply  Fax#: [phone]   Method Requested: Fax to Local Pharmacy Next Appointment Scheduled: No Future Appts. Initial call taken by: Barnie Mort,  March 05, 2010 8:53 AM  Follow-up for Phone Call        This appears to have been done, closed phone note. Lucious Groves CMA  March 05, 2010 1:30 PM     New/Updated Medications: PRAVACHOL 40 MG  TABS (PRAVASTATIN SODIUM) 1 by mouth at bedtime Prescriptions: PRAVACHOL 40 MG  TABS (PRAVASTATIN SODIUM) 1 by mouth at bedtime  #90 x 0   Entered by:   Doristine Devoid CMA   Authorized by:   Loreen Freud DO   Signed by:   Doristine Devoid CMA on 03/05/2010   Method used:   Electronically to        [facility]  [address] [id number] (retail)       [address]       [place], [place]  [address]       Ph: [phone] or [phone]       Fax: [phone]   RxID:   [id number]

## 2010-09-15 ENCOUNTER — Encounter: Payer: Self-pay | Admitting: Family Medicine

## 2010-09-15 ENCOUNTER — Ambulatory Visit (INDEPENDENT_AMBULATORY_CARE_PROVIDER_SITE_OTHER): Payer: BC Managed Care – PPO | Admitting: Family Medicine

## 2010-09-15 VITALS — BP 136/92 | HR 72 | Temp 97.8°F | Ht 60.75 in | Wt 149.0 lb

## 2010-09-15 DIAGNOSIS — Z0000 Encounter for general adult medical examination without abnormal findings: Secondary | ICD-10-CM

## 2010-09-15 DIAGNOSIS — J302 Other seasonal allergic rhinitis: Secondary | ICD-10-CM

## 2010-09-15 DIAGNOSIS — J309 Allergic rhinitis, unspecified: Secondary | ICD-10-CM

## 2010-09-15 DIAGNOSIS — E785 Hyperlipidemia, unspecified: Secondary | ICD-10-CM

## 2010-09-15 DIAGNOSIS — I10 Essential (primary) hypertension: Secondary | ICD-10-CM

## 2010-09-15 DIAGNOSIS — M1990 Unspecified osteoarthritis, unspecified site: Secondary | ICD-10-CM

## 2010-09-15 MED ORDER — AMLODIPINE BESYLATE 10 MG PO TABS: 10.0000 mg | ORAL_TABLET | Freq: Every day | ORAL | Status: DC

## 2010-09-15 MED ORDER — MOMETASONE FUROATE 50 MCG/ACT NA SUSP: 2.0000 | Freq: Every day | NASAL | Status: DC

## 2010-09-15 MED ORDER — CELECOXIB 200 MG PO CAPS: 200.0000 mg | ORAL_CAPSULE | Freq: Every day | ORAL | Status: DC

## 2010-09-15 NOTE — Patient Instructions (Signed)

## 2010-09-15 NOTE — Progress Notes (Signed)
Subjective:     Susan Torres is a 55 y.o. female and is here for a comprehensive physical exam. The patient reports no problems.  History   Social History  . Marital Status: Single    Spouse Name: N/A    Number of Children: N/A  . Years of Education: N/A   Occupational History  . [employer]    Social History Main Topics  . Smoking status: Passive Smoker  . Smokeless tobacco: Never Used  . Alcohol Use: Yes  . Drug Use: No  . Sexually Active: Not Currently -- Female partner(s)   Other Topics Concern  . Not on file   Social History Narrative  . No narrative on file   Health Maintenance  Topic Date Due  . Influenza Vaccine  10/06/2010  . Mammogram  09/25/2011  . Pap Smear  10/14/2012  . Tetanus/tdap  04/24/2018  . Colonoscopy  06/21/2018    The following portions of the patient's history were reviewed and updated as appropriate: allergies, current medications, past family history, past medical history, past social history, past surgical history and problem list.  Review of Systems Review of Systems  Constitutional: Negative for activity change, appetite change and fatigue.  HENT: Negative for hearing loss, congestion, tinnitus and ear discharge.  dentist q40m Eyes: Negative for visual disturbance (see optho q1y -- vision corrected to 20/20 with glasses).  Respiratory: Negative for cough, chest tightness and shortness of breath.   Cardiovascular: Negative for chest pain, palpitations and leg swelling.  Gastrointestinal: Negative for abdominal pain, diarrhea, constipation and abdominal distention.  Genitourinary: Negative for urgency, frequency, decreased urine volume and difficulty urinating.  Musculoskeletal: Negative for back pain, arthralgias and gait problem.  Skin: Negative for color change, pallor and rash.  Neurological: Negative for dizziness, light-headedness, numbness and headaches.  Hematological: Negative for adenopathy. Does not bruise/bleed easily.    Psychiatric/Behavioral: Negative for suicidal ideas, confusion, sleep disturbance, self-injury, dysphoric mood, decreased concentration and agitation.      Objective:    BP 136/92  Pulse 72  Temp(Src) 97.8 F (36.6 C) (Oral)  Ht 5' 0.75" (1.543 m)  Wt 149 lb (67.586 kg)  BMI 28.39 kg/m2  SpO2 98%  General Appearance:    Alert, cooperative, no distress, appears stated age  Head:    Normocephalic, without obvious abnormality, atraumatic  Eyes:    PERRL, conjunctiva/corneas clear, EOM's intact, fundi    benign, both eyes  Ears:    Normal TM's and external ear canals, both ears  Nose:   Nares normal, septum midline, mucosa normal, no drainage    or sinus tenderness  Throat:   Lips, mucosa, and tongue normal; teeth and gums normal  Neck:   Supple, symmetrical, trachea midline, no adenopathy;    thyroid:  no enlargement/tenderness/nodules; no carotid   bruit or JVD  Back:     Symmetric, no curvature, ROM normal, no CVA tenderness  Lungs:     Clear to auscultation bilaterally, respirations unlabored  Chest Wall:    No tenderness or deformity   Heart:    Regular rate and rhythm, S1 and S2 normal, no murmur, rub   or gallop  Breast Exam:    gyn  Abdomen:     Soft, non-tender, bowel sounds active all four quadrants,    no masses, no organomegaly  Genitalia:    gyn  Rectal:   gyn  Extremities:   Extremities normal, atraumatic, no cyanosis or edema  Pulses:   2+ and symmetric all  extremities  Skin:   Skin color, texture, turgor normal, no rashes or lesions--- + skin tag under r breast  Lymph nodes:   Cervical, supraclavicular, and axillary nodes normal  Neurologic:   CNII-XII intact, normal strength, sensation and reflexes    throughout      Assessment:    Healthy female exam.     HTN hyperlipidemia Plan:  Ghm utd con't meds Check labs   See After Visit Summary for Counseling Recommendations

## 2010-09-18 ENCOUNTER — Other Ambulatory Visit (HOSPITAL_COMMUNITY): Payer: Self-pay | Admitting: Obstetrics and Gynecology

## 2010-09-18 DIAGNOSIS — Z1231 Encounter for screening mammogram for malignant neoplasm of breast: Secondary | ICD-10-CM

## 2010-09-30 ENCOUNTER — Ambulatory Visit (HOSPITAL_COMMUNITY): Payer: BC Managed Care – PPO | Attending: Obstetrics and Gynecology

## 2010-10-01 ENCOUNTER — Encounter: Payer: Self-pay | Admitting: Family Medicine

## 2010-10-13 ENCOUNTER — Ambulatory Visit (HOSPITAL_COMMUNITY): Payer: BC Managed Care – PPO

## 2010-10-29 ENCOUNTER — Ambulatory Visit (HOSPITAL_COMMUNITY): Payer: BC Managed Care – PPO

## 2010-11-20 ENCOUNTER — Other Ambulatory Visit: Payer: Self-pay | Admitting: Family Medicine

## 2010-11-20 DIAGNOSIS — I10 Essential (primary) hypertension: Secondary | ICD-10-CM

## 2010-11-21 MED ORDER — AMLODIPINE BESYLATE 10 MG PO TABS: 10.0000 mg | ORAL_TABLET | Freq: Every day | ORAL | Status: DC

## 2010-12-15 ENCOUNTER — Ambulatory Visit (HOSPITAL_COMMUNITY)
Admission: RE | Admit: 2010-12-15 | Discharge: 2010-12-15 | Disposition: A | Discharge status: Home / Self Care | Payer: BC Managed Care – PPO | Source: Ambulatory Visit | Attending: Obstetrics and Gynecology | Admitting: Obstetrics and Gynecology

## 2010-12-15 DIAGNOSIS — Z1231 Encounter for screening mammogram for malignant neoplasm of breast: Secondary | ICD-10-CM

## 2011-05-26 ENCOUNTER — Other Ambulatory Visit: Payer: Self-pay | Admitting: Family Medicine

## 2011-08-10 ENCOUNTER — Telehealth: Payer: Self-pay | Admitting: Family Medicine

## 2011-08-10 DIAGNOSIS — M1990 Unspecified osteoarthritis, unspecified site: Secondary | ICD-10-CM

## 2011-08-10 MED ORDER — CELECOXIB 200 MG PO CAPS: 200.0000 mg | ORAL_CAPSULE | Freq: Every day | ORAL | Status: DC

## 2011-08-10 NOTE — Telephone Encounter (Signed)
Last seen 09/15/10.  No pending apt. Please advise      KP

## 2011-08-10 NOTE — Telephone Encounter (Signed)
Refill 1x ---then needs appointment

## 2011-08-10 NOTE — Telephone Encounter (Signed)
Refill: Celebrex 200mg  capsule. Take 1 capsule by mouth daily. Qty 30. Last fill 06-26-11

## 2011-09-09 ENCOUNTER — Other Ambulatory Visit: Payer: Self-pay | Admitting: Family Medicine

## 2011-09-09 MED ORDER — PRAVASTATIN SODIUM 40 MG PO TABS: 40.0000 mg | ORAL_TABLET | Freq: Every day | ORAL | Status: DC

## 2011-09-09 MED ORDER — AMLODIPINE BESYLATE 10 MG PO TABS: 10.0000 mg | ORAL_TABLET | Freq: Every day | ORAL | Status: DC

## 2011-09-09 NOTE — Telephone Encounter (Signed)
refill x 2 amlodipine & pravastatin last ov 9.10.12 V70  Future CPE scheduled for 10.8.13  Amlodipine besylate 10mg  tab #90 take 1-tablet by mouth daily last fill 5.21.13  Pravastatin sodium 40mg  tablet #90 take 1-tablet at bedtime last fill 5.21.13

## 2011-10-13 ENCOUNTER — Ambulatory Visit (INDEPENDENT_AMBULATORY_CARE_PROVIDER_SITE_OTHER): Payer: BC Managed Care – PPO | Admitting: Family Medicine

## 2011-10-13 ENCOUNTER — Encounter: Payer: Self-pay | Admitting: Family Medicine

## 2011-10-13 VITALS — BP 140/82 | HR 76 | Temp 97.9°F | Ht 60.0 in | Wt 147.0 lb

## 2011-10-13 DIAGNOSIS — Z0000 Encounter for general adult medical examination without abnormal findings: Secondary | ICD-10-CM

## 2011-10-13 DIAGNOSIS — D239 Other benign neoplasm of skin, unspecified: Secondary | ICD-10-CM

## 2011-10-13 DIAGNOSIS — D229 Melanocytic nevi, unspecified: Secondary | ICD-10-CM

## 2011-10-13 DIAGNOSIS — M1990 Unspecified osteoarthritis, unspecified site: Secondary | ICD-10-CM

## 2011-10-13 DIAGNOSIS — I10 Essential (primary) hypertension: Secondary | ICD-10-CM

## 2011-10-13 DIAGNOSIS — Z23 Encounter for immunization: Secondary | ICD-10-CM

## 2011-10-13 DIAGNOSIS — E785 Hyperlipidemia, unspecified: Secondary | ICD-10-CM

## 2011-10-13 LAB — POCT URINALYSIS DIPSTICK
Bilirubin, UA: NEGATIVE
Blood, UA: NEGATIVE
Glucose, UA: NEGATIVE
Leukocytes, UA: NEGATIVE
Nitrite, UA: NEGATIVE

## 2011-10-13 MED ORDER — CELECOXIB 200 MG PO CAPS: 200.0000 mg | ORAL_CAPSULE | Freq: Every day | ORAL | Status: DC

## 2011-10-13 MED ORDER — AMLODIPINE BESY-BENAZEPRIL HCL 5-10 MG PO CAPS: 1.0000 | ORAL_CAPSULE | Freq: Every day | ORAL | Status: DC

## 2011-10-13 NOTE — Patient Instructions (Signed)
Preventive Care for Adults, Female A healthy lifestyle and preventive care can promote health and wellness. Preventive health guidelines for women include the following key practices.  A routine yearly physical is a good way to check with your caregiver about your health and preventive screening. It is a chance to share any concerns and updates on your health, and to receive a thorough exam.  Visit your dentist for a routine exam and preventive care every 6 months. Brush your teeth twice a day and floss once a day. Good oral hygiene prevents tooth decay and gum disease.  The frequency of eye exams is based on your age, health, family medical history, use of contact lenses, and other factors. Follow your caregiver's recommendations for frequency of eye exams.  Eat a healthy diet. Foods like vegetables, fruits, whole grains, low-fat dairy products, and lean protein foods contain the nutrients you need without too many calories. Decrease your intake of foods high in solid fats, added sugars, and salt. Eat the right amount of calories for you.Get information about a proper diet from your caregiver, if necessary.  Regular physical exercise is one of the most important things you can do for your health. Most adults should get at least 150 minutes of moderate-intensity exercise (any activity that increases your heart rate and causes you to sweat) each week. In addition, most adults need muscle-strengthening exercises on 2 or more days a week.  Maintain a healthy weight. The body mass index (BMI) is a screening tool to identify possible weight problems. It provides an estimate of body fat based on height and weight. Your caregiver can help determine your BMI, and can help you achieve or maintain a healthy weight.For adults 20 years and older:  A BMI below 18.5 is considered underweight.  A BMI of 18.5 to 24.9 is normal.  A BMI of 25 to 29.9 is considered overweight.  A BMI of 30 and above is  considered obese.  Maintain normal blood lipids and cholesterol levels by exercising and minimizing your intake of saturated fat. Eat a balanced diet with plenty of fruit and vegetables. Blood tests for lipids and cholesterol should begin at age 20 and be repeated every 5 years. If your lipid or cholesterol levels are high, you are over 50, or you are at high risk for heart disease, you may need your cholesterol levels checked more frequently.Ongoing high lipid and cholesterol levels should be treated with medicines if diet and exercise are not effective.  If you smoke, find out from your caregiver how to quit. If you do not use tobacco, do not start.  If you are pregnant, do not drink alcohol. If you are breastfeeding, be very cautious about drinking alcohol. If you are not pregnant and choose to drink alcohol, do not exceed 1 drink per day. One drink is considered to be 12 ounces (355 mL) of beer, 5 ounces (148 mL) of wine, or 1.5 ounces (44 mL) of liquor.  Avoid use of street drugs. Do not share needles with anyone. Ask for help if you need support or instructions about stopping the use of drugs.  High blood pressure causes heart disease and increases the risk of stroke. Your blood pressure should be checked at least every 1 to 2 years. Ongoing high blood pressure should be treated with medicines if weight loss and exercise are not effective.  If you are 54 to 54 years old, ask your caregiver if you should take aspirin to prevent strokes.  Diabetes   screening involves taking a blood sample to check your fasting blood sugar level. This should be done once every 3 years, after age 45, if you are within normal weight and without risk factors for diabetes. Testing should be considered at a younger age or be carried out more frequently if you are overweight and have at least 1 risk factor for diabetes.  Breast cancer screening is essential preventive care for women. You should practice "breast  self-awareness." This means understanding the normal appearance and feel of your breasts and may include breast self-examination. Any changes detected, no matter how small, should be reported to a caregiver. Women in their 20s and 30s should have a clinical breast exam (CBE) by a caregiver as part of a regular health exam every 1 to 3 years. After age 40, women should have a CBE every year. Starting at age 40, women should consider having a mammography (breast X-ray test) every year. Women who have a family history of breast cancer should talk to their caregiver about genetic screening. Women at a high risk of breast cancer should talk to their caregivers about having magnetic resonance imaging (MRI) and a mammography every year.  The Pap test is a screening test for cervical cancer. A Pap test can show cell changes on the cervix that might become cervical cancer if left untreated. A Pap test is a procedure in which cells are obtained and examined from the lower end of the uterus (cervix).  Women should have a Pap test starting at age 21.  Between ages 21 and 29, Pap tests should be repeated every 2 years.  Beginning at age 30, you should have a Pap test every 3 years as long as the past 3 Pap tests have been normal.  Some women have medical problems that increase the chance of getting cervical cancer. Talk to your caregiver about these problems. It is especially important to talk to your caregiver if a new problem develops soon after your last Pap test. In these cases, your caregiver may recommend more frequent screening and Pap tests.  The above recommendations are the same for women who have or have not gotten the vaccine for human papillomavirus (HPV).  If you had a hysterectomy for a problem that was not cancer or a condition that could lead to cancer, then you no longer need Pap tests. Even if you no longer need a Pap test, a regular exam is a good idea to make sure no other problems are  starting.  If you are between ages 65 and 70, and you have had normal Pap tests going back 10 years, you no longer need Pap tests. Even if you no longer need a Pap test, a regular exam is a good idea to make sure no other problems are starting.  If you have had past treatment for cervical cancer or a condition that could lead to cancer, you need Pap tests and screening for cancer for at least 20 years after your treatment.  If Pap tests have been discontinued, risk factors (such as a new sexual partner) need to be reassessed to determine if screening should be resumed.  The HPV test is an additional test that may be used for cervical cancer screening. The HPV test looks for the virus that can cause the cell changes on the cervix. The cells collected during the Pap test can be tested for HPV. The HPV test could be used to screen women aged 30 years and older, and should   be used in women of any age who have unclear Pap test results. After the age of 30, women should have HPV testing at the same frequency as a Pap test.  Colorectal cancer can be detected and often prevented. Most routine colorectal cancer screening begins at the age of 50 and continues through age 75. However, your caregiver may recommend screening at an earlier age if you have risk factors for colon cancer. On a yearly basis, your caregiver may provide home test kits to check for hidden blood in the stool. Use of a small camera at the end of a tube, to directly examine the colon (sigmoidoscopy or colonoscopy), can detect the earliest forms of colorectal cancer. Talk to your caregiver about this at age 50, when routine screening begins. Direct examination of the colon should be repeated every 5 to 10 years through age 75, unless early forms of pre-cancerous polyps or small growths are found.  Hepatitis C blood testing is recommended for all people born from 1945 through 1965 and any individual with known risks for hepatitis C.  Practice  safe sex. Use condoms and avoid high-risk sexual practices to reduce the spread of sexually transmitted infections (STIs). STIs include gonorrhea, chlamydia, syphilis, trichomonas, herpes, HPV, and human immunodeficiency virus (HIV). Herpes, HIV, and HPV are viral illnesses that have no cure. They can result in disability, cancer, and death. Sexually active women aged 25 and younger should be checked for chlamydia. Older women with new or multiple partners should also be tested for chlamydia. Testing for other STIs is recommended if you are sexually active and at increased risk.  Osteoporosis is a disease in which the bones lose minerals and strength with aging. This can result in serious bone fractures. The risk of osteoporosis can be identified using a bone density scan. Women ages 65 and over and women at risk for fractures or osteoporosis should discuss screening with their caregivers. Ask your caregiver whether you should take a calcium supplement or vitamin D to reduce the rate of osteoporosis.  Menopause can be associated with physical symptoms and risks. Hormone replacement therapy is available to decrease symptoms and risks. You should talk to your caregiver about whether hormone replacement therapy is right for you.  Use sunscreen with sun protection factor (SPF) of 30 or more. Apply sunscreen liberally and repeatedly throughout the day. You should seek shade when your shadow is shorter than you. Protect yourself by wearing long sleeves, pants, a wide-brimmed hat, and sunglasses year round, whenever you are outdoors.  Once a month, do a whole body skin exam, using a mirror to look at the skin on your back. Notify your caregiver of new moles, moles that have irregular borders, moles that are larger than a pencil eraser, or moles that have changed in shape or color.  Stay current with required immunizations.  Influenza. You need a dose every fall (or winter). The composition of the flu vaccine  changes each year, so being vaccinated once is not enough.  Pneumococcal polysaccharide. You need 1 to 2 doses if you smoke cigarettes or if you have certain chronic medical conditions. You need 1 dose at age 65 (or older) if you have never been vaccinated.  Tetanus, diphtheria, pertussis (Tdap, Td). Get 1 dose of Tdap vaccine if you are younger than age 65, are over 65 and have contact with an infant, are a healthcare worker, are pregnant, or simply want to be protected from whooping cough. After that, you need a Td   booster dose every 10 years. Consult your caregiver if you have not had at least 3 tetanus and diphtheria-containing shots sometime in your life or have a deep or dirty wound.  HPV. You need this vaccine if you are a woman age 26 or younger. The vaccine is given in 3 doses over 6 months.  Measles, mumps, rubella (MMR). You need at least 1 dose of MMR if you were born in 1957 or later. You may also need a second dose.  Meningococcal. If you are age 19 to 21 and a first-year college student living in a residence hall, or have one of several medical conditions, you need to get vaccinated against meningococcal disease. You may also need additional booster doses.  Zoster (shingles). If you are age 60 or older, you should get this vaccine.  Varicella (chickenpox). If you have never had chickenpox or you were vaccinated but received only 1 dose, talk to your caregiver to find out if you need this vaccine.  Hepatitis A. You need this vaccine if you have a specific risk factor for hepatitis A virus infection or you simply wish to be protected from this disease. The vaccine is usually given as 2 doses, 6 to 18 months apart.  Hepatitis B. You need this vaccine if you have a specific risk factor for hepatitis B virus infection or you simply wish to be protected from this disease. The vaccine is given in 3 doses, usually over 6 months. Preventive Services / Frequency Ages 19 to 39  Blood  pressure check.** / Every 1 to 2 years.  Lipid and cholesterol check.** / Every 5 years beginning at age 20.  Clinical breast exam.** / Every 3 years for women in their 20s and 30s.  Pap test.** / Every 2 years from ages 21 through 29. Every 3 years starting at age 30 through age 65 or 70 with a history of 3 consecutive normal Pap tests.  HPV screening.** / Every 3 years from ages 30 through ages 65 to 70 with a history of 3 consecutive normal Pap tests.  Hepatitis C blood test.** / For any individual with known risks for hepatitis C.  Skin self-exam. / Monthly.  Influenza immunization.** / Every year.  Pneumococcal polysaccharide immunization.** / 1 to 2 doses if you smoke cigarettes or if you have certain chronic medical conditions.  Tetanus, diphtheria, pertussis (Tdap, Td) immunization. / A one-time dose of Tdap vaccine. After that, you need a Td booster dose every 10 years.  HPV immunization. / 3 doses over 6 months, if you are 26 and younger.  Measles, mumps, rubella (MMR) immunization. / You need at least 1 dose of MMR if you were born in 1957 or later. You may also need a second dose.  Meningococcal immunization. / 1 dose if you are age 19 to 21 and a first-year college student living in a residence hall, or have one of several medical conditions, you need to get vaccinated against meningococcal disease. You may also need additional booster doses.  Varicella immunization.** / Consult your caregiver.  Hepatitis A immunization.** / Consult your caregiver. 2 doses, 6 to 18 months apart.  Hepatitis B immunization.** / Consult your caregiver. 3 doses usually over 6 months. Ages 40 to 64  Blood pressure check.** / Every 1 to 2 years.  Lipid and cholesterol check.** / Every 5 years beginning at age 20.  Clinical breast exam.** / Every year after age 40.  Mammogram.** / Every year beginning at age 40   and continuing for as long as you are in good health. Consult with your  caregiver.  Pap test.** / Every 3 years starting at age 30 through age 65 or 70 with a history of 3 consecutive normal Pap tests.  HPV screening.** / Every 3 years from ages 30 through ages 65 to 70 with a history of 3 consecutive normal Pap tests.  Fecal occult blood test (FOBT) of stool. / Every year beginning at age 50 and continuing until age 75. You may not need to do this test if you get a colonoscopy every 10 years.  Flexible sigmoidoscopy or colonoscopy.** / Every 5 years for a flexible sigmoidoscopy or every 10 years for a colonoscopy beginning at age 50 and continuing until age 75.  Hepatitis C blood test.** / For all people born from 1945 through 1965 and any individual with known risks for hepatitis C.  Skin self-exam. / Monthly.  Influenza immunization.** / Every year.  Pneumococcal polysaccharide immunization.** / 1 to 2 doses if you smoke cigarettes or if you have certain chronic medical conditions.  Tetanus, diphtheria, pertussis (Tdap, Td) immunization.** / A one-time dose of Tdap vaccine. After that, you need a Td booster dose every 10 years.  Measles, mumps, rubella (MMR) immunization. / You need at least 1 dose of MMR if you were born in 1957 or later. You may also need a second dose.  Varicella immunization.** / Consult your caregiver.  Meningococcal immunization.** / Consult your caregiver.  Hepatitis A immunization.** / Consult your caregiver. 2 doses, 6 to 18 months apart.  Hepatitis B immunization.** / Consult your caregiver. 3 doses, usually over 6 months. Ages 65 and over  Blood pressure check.** / Every 1 to 2 years.  Lipid and cholesterol check.** / Every 5 years beginning at age 20.  Clinical breast exam.** / Every year after age 40.  Mammogram.** / Every year beginning at age 40 and continuing for as long as you are in good health. Consult with your caregiver.  Pap test.** / Every 3 years starting at age 30 through age 65 or 70 with a 3  consecutive normal Pap tests. Testing can be stopped between 65 and 70 with 3 consecutive normal Pap tests and no abnormal Pap or HPV tests in the past 10 years.  HPV screening.** / Every 3 years from ages 30 through ages 65 or 70 with a history of 3 consecutive normal Pap tests. Testing can be stopped between 65 and 70 with 3 consecutive normal Pap tests and no abnormal Pap or HPV tests in the past 10 years.  Fecal occult blood test (FOBT) of stool. / Every year beginning at age 50 and continuing until age 75. You may not need to do this test if you get a colonoscopy every 10 years.  Flexible sigmoidoscopy or colonoscopy.** / Every 5 years for a flexible sigmoidoscopy or every 10 years for a colonoscopy beginning at age 50 and continuing until age 75.  Hepatitis C blood test.** / For all people born from 1945 through 1965 and any individual with known risks for hepatitis C.  Osteoporosis screening.** / A one-time screening for women ages 65 and over and women at risk for fractures or osteoporosis.  Skin self-exam. / Monthly.  Influenza immunization.** / Every year.  Pneumococcal polysaccharide immunization.** / 1 dose at age 65 (or older) if you have never been vaccinated.  Tetanus, diphtheria, pertussis (Tdap, Td) immunization. / A one-time dose of Tdap vaccine if you are over   65 and have contact with an infant, are a healthcare worker, or simply want to be protected from whooping cough. After that, you need a Td booster dose every 10 years.  Varicella immunization.** / Consult your caregiver.  Meningococcal immunization.** / Consult your caregiver.  Hepatitis A immunization.** / Consult your caregiver. 2 doses, 6 to 18 months apart.  Hepatitis B immunization.** / Check with your caregiver. 3 doses, usually over 6 months. ** Family history and personal history of risk and conditions may change your caregiver's recommendations. Document Released: 02/17/2001 Document Revised: 03/16/2011  Document Reviewed: 05/19/2010 [employer] Patient Information 2013 [employer]  

## 2011-10-13 NOTE — Progress Notes (Signed)
  Subjective:     Susan Torres is a 56 y.o. female and is here for a comprehensive physical exam. The patient reports increased gas and stomach rumbling.    History   Social History  . Marital Status: Single    Spouse Name: N/A    Number of Children: N/A  . Years of Education: N/A   Occupational History  . [employer]    Social History Main Topics  . Smoking status: Passive Smoke Exposure - Never Smoker  . Smokeless tobacco: Never Used  . Alcohol Use: Yes  . Drug Use: No  . Sexually Active: Not Currently -- Female partner(s)   Other Topics Concern  . Not on file   Social History Narrative  . No narrative on file   Health Maintenance  Topic Date Due  . Influenza Vaccine  09/05/2012  . Pap Smear  10/14/2012  . Mammogram  12/14/2012  . Tetanus/tdap  04/24/2018  . Colonoscopy  06/21/2018    The following portions of the patient's history were reviewed and updated as appropriate: allergies, current medications, past family history, past medical history, past social history, past surgical history and problem list.  Review of Systems Review of Systems  Constitutional: Negative for activity change, appetite change and fatigue.  HENT: Negative for hearing loss, congestion, tinnitus and ear discharge.  dentist q26m Eyes: Negative for visual disturbance (see optho q2y -- vision corrected to 20/20 with glasses).  Respiratory: Negative for cough, chest tightness and shortness of breath.   Cardiovascular: Negative for chest pain, palpitations and leg swelling.  Gastrointestinal: Negative for abdominal pain, diarrhea, constipation and abdominal distention.  Genitourinary: Negative for urgency, frequency, decreased urine volume and difficulty urinating.  Musculoskeletal: Negative for back pain, arthralgias and gait problem.  Skin: Negative for color change, pallor and rash.  Neurological: Negative for dizziness, light-headedness, numbness and headaches.  Hematological: Negative for  adenopathy. Does not bruise/bleed easily.  Psychiatric/Behavioral: Negative for suicidal ideas, confusion, sleep disturbance, self-injury, dysphoric mood, decreased concentration and agitation.       Objective:    BP 140/82  Pulse 76  Temp 97.9 F (36.6 C) (Oral)  Ht 5' (1.524 m)  Wt 147 lb (66.679 kg)  BMI 28.71 kg/m2  SpO2 98% General appearance: alert, cooperative, appears stated age and no distress Head: Normocephalic, without obvious abnormality, atraumatic Eyes: conjunctivae/corneas clear. PERRL, EOM's intact. Fundi benign. Ears: normal TM's and external ear canals both ears Nose: Nares normal. Septum midline. Mucosa normal. No drainage or sinus tenderness. Throat: lips, mucosa, and tongue normal; teeth and gums normal Neck: no adenopathy, no carotid bruit, no JVD, supple, symmetrical, trachea midline and thyroid not enlarged, symmetric, no tenderness/mass/nodules Back: symmetric, no curvature. ROM normal. No CVA tenderness. Lungs: clear to auscultation bilaterally Breasts: gyn Heart: regular rate and rhythm, S1, S2 normal, no murmur, click, rub or gallop Abdomen: soft, non-tender; bowel sounds normal; no masses,  no organomegaly Pelvic: deferred--gyn Extremities: extremities normal, atraumatic, no cyanosis or edema Pulses: 2+ and symmetric Skin: Skin color, texture, turgor normal. No rashes or lesions Lymph nodes: Cervical, supraclavicular, and axillary nodes normal. Neurologic: Alert and oriented X 3, normal strength and tone. Normal symmetric reflexes. Normal coordination and gait psych-- no anxiety , no depression    Assessment:    Healthy female exam.      Plan:    ghm ud Check labs See After Visit Summary for Counseling Recommendations

## 2011-10-14 ENCOUNTER — Telehealth: Payer: Self-pay | Admitting: *Deleted

## 2011-10-14 NOTE — Assessment & Plan Note (Signed)
Cont meds Check labs 

## 2011-10-14 NOTE — Telephone Encounter (Signed)
msg left to call the office     KP 

## 2011-10-14 NOTE — Assessment & Plan Note (Signed)
Check labs and con't meds 

## 2011-10-14 NOTE — Telephone Encounter (Signed)
Pt did not complain about any symptoms of low sugar yesterday.   Please call pt and make sure she is not having any symptoms of low blood sugar.

## 2011-10-14 NOTE — Telephone Encounter (Signed)
Received call from [facility] with Wynell Balloon to advised pt critical glucose of 37, MD Lowne made aware verbally, noted incoming fax highlighted and placed on MD [facility] desk for review and instructions,

## 2011-10-16 NOTE — Telephone Encounter (Signed)
.  left message to have patient return my call.  

## 2011-10-23 NOTE — Telephone Encounter (Signed)
After several attempts.    Letter mailed--     KP

## 2011-10-28 ENCOUNTER — Telehealth: Payer: Self-pay

## 2011-10-28 NOTE — Telephone Encounter (Signed)
Labs say collected but are not resulted- will need to ask lab to check on status

## 2011-10-28 NOTE — Telephone Encounter (Signed)
Pt called and left a message on triage line during lunch concerning lab results. I don't see labs done on 10/13/11 resulted.   PLz advise     MW

## 2011-10-28 NOTE — Telephone Encounter (Signed)
Pt called in concerning lab results I advised pt once labs are resulted I will call her back.    MW

## 2011-11-03 ENCOUNTER — Ambulatory Visit: Payer: BC Managed Care – PPO | Admitting: Family Medicine

## 2011-11-06 ENCOUNTER — Encounter: Payer: Self-pay | Admitting: Family Medicine

## 2011-11-06 ENCOUNTER — Ambulatory Visit (INDEPENDENT_AMBULATORY_CARE_PROVIDER_SITE_OTHER): Payer: BC Managed Care – PPO | Admitting: Family Medicine

## 2011-11-06 VITALS — BP 137/93 | HR 66 | Temp 98.5°F | Wt 146.0 lb

## 2011-11-06 DIAGNOSIS — E162 Hypoglycemia, unspecified: Secondary | ICD-10-CM | POA: Insufficient documentation

## 2011-11-06 DIAGNOSIS — I10 Essential (primary) hypertension: Secondary | ICD-10-CM

## 2011-11-06 MED ORDER — AMLODIPINE BESY-BENAZEPRIL HCL 5-10 MG PO CAPS: 1.0000 | ORAL_CAPSULE | Freq: Every day | ORAL | Status: DC

## 2011-11-06 NOTE — Assessment & Plan Note (Signed)
Eat 3 meals and 2 protein snacks a day rto prn

## 2011-11-06 NOTE — Patient Instructions (Signed)

## 2011-11-06 NOTE — Progress Notes (Signed)
  Subjective:    Patient ID: Susan Torres, female    DOB: 1955-04-05, 56 y.o.   MRN: [id number]  HPI Pt here for bp check on new bp med.  Swelling has resolved and pt has no other complaints.  Pt BS was low and she admitted to being shakey day of pe.    Review of Systems As above    Objective:   Physical Exam BP 137/93  Pulse 66  Temp 98.5 F (36.9 C) (Oral)  Wt 146 lb (66.225 kg)  SpO2 98% General appearance: alert, cooperative, appears stated age and no distress Extremities: extremities normal, atraumatic, no cyanosis or edema     Assessment & Plan:

## 2011-11-06 NOTE — Assessment & Plan Note (Signed)
Stable con't meds rto 3 months 

## 2011-11-17 ENCOUNTER — Other Ambulatory Visit (HOSPITAL_COMMUNITY): Payer: Self-pay | Admitting: Obstetrics and Gynecology

## 2011-11-17 DIAGNOSIS — Z1231 Encounter for screening mammogram for malignant neoplasm of breast: Secondary | ICD-10-CM

## 2011-12-11 ENCOUNTER — Other Ambulatory Visit: Payer: Self-pay | Admitting: Family Medicine

## 2011-12-21 ENCOUNTER — Ambulatory Visit (HOSPITAL_COMMUNITY): Payer: BC Managed Care – PPO

## 2011-12-21 ENCOUNTER — Other Ambulatory Visit: Payer: BC Managed Care – PPO

## 2011-12-28 ENCOUNTER — Other Ambulatory Visit: Payer: BC Managed Care – PPO

## 2011-12-31 ENCOUNTER — Other Ambulatory Visit (INDEPENDENT_AMBULATORY_CARE_PROVIDER_SITE_OTHER): Payer: BC Managed Care – PPO

## 2011-12-31 DIAGNOSIS — E785 Hyperlipidemia, unspecified: Secondary | ICD-10-CM

## 2012-01-08 ENCOUNTER — Ambulatory Visit (HOSPITAL_COMMUNITY)
Admission: RE | Admit: 2012-01-08 | Discharge: 2012-01-08 | Disposition: A | Discharge status: Home / Self Care | Payer: BC Managed Care – PPO | Source: Ambulatory Visit | Attending: Obstetrics and Gynecology | Admitting: Obstetrics and Gynecology

## 2012-01-08 DIAGNOSIS — Z1231 Encounter for screening mammogram for malignant neoplasm of breast: Secondary | ICD-10-CM

## 2012-01-19 ENCOUNTER — Other Ambulatory Visit: Payer: Self-pay | Admitting: Family Medicine

## 2012-04-23 ENCOUNTER — Other Ambulatory Visit: Payer: Self-pay | Admitting: Family Medicine

## 2012-04-25 NOTE — Telephone Encounter (Signed)
Med filled.  

## 2012-07-06 ENCOUNTER — Other Ambulatory Visit (HOSPITAL_COMMUNITY): Payer: Self-pay | Admitting: Orthopedic Surgery

## 2012-07-06 DIAGNOSIS — M8580 Other specified disorders of bone density and structure, unspecified site: Secondary | ICD-10-CM

## 2012-07-12 ENCOUNTER — Ambulatory Visit (HOSPITAL_COMMUNITY)
Admission: RE | Admit: 2012-07-12 | Discharge: 2012-07-12 | Disposition: A | Discharge status: Home / Self Care | Payer: BC Managed Care – PPO | Source: Ambulatory Visit | Attending: Orthopedic Surgery | Admitting: Orthopedic Surgery

## 2012-07-12 DIAGNOSIS — M8580 Other specified disorders of bone density and structure, unspecified site: Secondary | ICD-10-CM

## 2012-07-12 DIAGNOSIS — Z780 Asymptomatic menopausal state: Secondary | ICD-10-CM | POA: Insufficient documentation

## 2012-07-12 DIAGNOSIS — Z13820 Encounter for screening for osteoporosis: Secondary | ICD-10-CM | POA: Insufficient documentation

## 2012-07-14 ENCOUNTER — Other Ambulatory Visit: Payer: BC Managed Care – PPO

## 2012-07-22 ENCOUNTER — Other Ambulatory Visit: Payer: Self-pay | Admitting: Family Medicine

## 2012-08-20 ENCOUNTER — Other Ambulatory Visit: Payer: Self-pay | Admitting: Family Medicine

## 2012-10-23 ENCOUNTER — Other Ambulatory Visit: Payer: Self-pay | Admitting: Family Medicine

## 2012-10-24 ENCOUNTER — Ambulatory Visit (INDEPENDENT_AMBULATORY_CARE_PROVIDER_SITE_OTHER): Payer: BC Managed Care – PPO | Admitting: Family Medicine

## 2012-10-24 ENCOUNTER — Encounter: Payer: Self-pay | Admitting: Family Medicine

## 2012-10-24 VITALS — BP 128/78 | HR 66 | Temp 98.5°F | Wt 149.4 lb

## 2012-10-24 DIAGNOSIS — M899 Disorder of bone, unspecified: Secondary | ICD-10-CM

## 2012-10-24 DIAGNOSIS — I10 Essential (primary) hypertension: Secondary | ICD-10-CM

## 2012-10-24 DIAGNOSIS — M8580 Other specified disorders of bone density and structure, unspecified site: Secondary | ICD-10-CM

## 2012-10-24 DIAGNOSIS — E785 Hyperlipidemia, unspecified: Secondary | ICD-10-CM

## 2012-10-24 MED ORDER — PRAVASTATIN SODIUM 40 MG PO TABS: ORAL_TABLET | ORAL | Status: DC

## 2012-10-24 NOTE — Progress Notes (Signed)
  Subjective:    Patient here for follow-up of elevated blood pressure.  She is exercising and is adherent to a low-salt diet.  Blood pressure is well controlled at home. Cardiac symptoms: none. Patient denies: chest pain, chest pressure/discomfort, claudication, dyspnea, exertional chest pressure/discomfort, fatigue, irregular heart beat, lower extremity edema, near-syncope, orthopnea, palpitations, paroxysmal nocturnal dyspnea, syncope and tachypnea. Cardiovascular risk factors: dyslipidemia, hypertension and sedentary lifestyle. Use of agents associated with hypertension: none. History of target organ damage: none.  The following portions of the patient's history were reviewed and updated as appropriate: allergies, current medications, past family history, past medical history, past social history, past surgical history and problem list.  Review of Systems Pertinent items are noted in HPI.     Objective:    BP 128/78  Pulse 66  Temp(Src) 98.5 F (36.9 C) (Oral)  Wt 149 lb 6.4 oz (67.767 kg)  BMI 29.18 kg/m2  SpO2 99% General appearance: alert, cooperative, appears stated age and no distress Throat: lips, mucosa, and tongue normal; teeth and gums normal Neck: no adenopathy, no carotid bruit, no JVD, supple, symmetrical, trachea midline and thyroid not enlarged, symmetric, no tenderness/mass/nodules Lungs: clear to auscultation bilaterally Heart: S1, S2 normal Extremities: extremities normal, atraumatic, no cyanosis or edema    Assessment:    Hypertension, normal blood pressure . Evidence of target organ damage: none.    Plan:    Medication: no change. Dietary sodium restriction. Regular aerobic exercise. Check blood pressures 2-3 times weekly and record. Follow up: 6 months and as needed.

## 2012-10-24 NOTE — Assessment & Plan Note (Signed)
Stable Cont meds 

## 2012-10-24 NOTE — Patient Instructions (Signed)

## 2012-10-24 NOTE — Assessment & Plan Note (Signed)
stable °

## 2012-11-27 ENCOUNTER — Other Ambulatory Visit: Payer: Self-pay | Admitting: Family Medicine

## 2012-11-28 ENCOUNTER — Other Ambulatory Visit: Payer: Self-pay | Admitting: Family Medicine

## 2012-12-05 ENCOUNTER — Encounter: Payer: BC Managed Care – PPO | Admitting: Family Medicine

## 2012-12-05 DIAGNOSIS — Z0289 Encounter for other administrative examinations: Secondary | ICD-10-CM

## 2012-12-25 ENCOUNTER — Other Ambulatory Visit: Payer: Self-pay | Admitting: Family Medicine

## 2012-12-30 ENCOUNTER — Other Ambulatory Visit: Payer: Self-pay | Admitting: Family Medicine

## 2013-02-16 ENCOUNTER — Other Ambulatory Visit: Payer: Self-pay | Admitting: Family Medicine

## 2013-02-17 ENCOUNTER — Telehealth: Payer: Self-pay

## 2013-02-17 NOTE — Telephone Encounter (Signed)
Medication and allergies:  Reviewed and updated  90 day supply/mail order: n/a Local pharmacy:  [facility] [address]   Immunizations due:  UTD   A/P: No changes to personal, family history or past surgical hx PAP- 08/24/12 CCS- 06/20/08-negative, repeat every 10 years. 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

## 2013-02-20 ENCOUNTER — Ambulatory Visit (INDEPENDENT_AMBULATORY_CARE_PROVIDER_SITE_OTHER): Payer: BC Managed Care – PPO | Admitting: Family Medicine

## 2013-02-20 ENCOUNTER — Encounter: Payer: Self-pay | Admitting: Family Medicine

## 2013-02-20 VITALS — BP 120/86 | HR 72 | Temp 98.4°F | Resp 16 | Ht 61.5 in | Wt 146.5 lb

## 2013-02-20 DIAGNOSIS — N390 Urinary tract infection, site not specified: Secondary | ICD-10-CM

## 2013-02-20 DIAGNOSIS — Z0000 Encounter for general adult medical examination without abnormal findings: Secondary | ICD-10-CM

## 2013-02-20 DIAGNOSIS — M899 Disorder of bone, unspecified: Secondary | ICD-10-CM

## 2013-02-20 DIAGNOSIS — M949 Disorder of cartilage, unspecified: Secondary | ICD-10-CM

## 2013-02-20 DIAGNOSIS — M8580 Other specified disorders of bone density and structure, unspecified site: Secondary | ICD-10-CM

## 2013-02-20 DIAGNOSIS — I10 Essential (primary) hypertension: Secondary | ICD-10-CM

## 2013-02-20 DIAGNOSIS — M19049 Primary osteoarthritis, unspecified hand: Secondary | ICD-10-CM

## 2013-02-20 DIAGNOSIS — E785 Hyperlipidemia, unspecified: Secondary | ICD-10-CM

## 2013-02-20 LAB — POCT URINALYSIS DIPSTICK
BILIRUBIN UA: NEGATIVE
Glucose, UA: NEGATIVE
Ketones, UA: NEGATIVE
Nitrite, UA: NEGATIVE
Protein, UA: NEGATIVE
RBC UA: NEGATIVE
Urobilinogen, UA: 0.2
pH, UA: 6

## 2013-02-20 MED ORDER — MELOXICAM 15 MG PO TABS: 15.0000 mg | ORAL_TABLET | Freq: Every day | ORAL | Status: DC

## 2013-02-20 MED ORDER — IBANDRONATE SODIUM 150 MG PO TABS: 150.0000 mg | ORAL_TABLET | ORAL | Status: DC

## 2013-02-20 NOTE — Patient Instructions (Signed)

## 2013-02-20 NOTE — Progress Notes (Signed)
Pre visit review using our clinic review tool, if applicable. No additional management support is needed unless otherwise documented below in the visit note. 

## 2013-02-20 NOTE — Progress Notes (Signed)
Subjective:     Susan Torres is a 58 y.o. female and is here for a comprehensive physical exam. The patient reports no problems.  History   Social History  . Marital Status: Single    Spouse Name: N/A    Number of Children: N/A  . Years of Education: N/A   Occupational History  . [employer]    Social History Main Topics  . Smoking status: Passive Smoke Exposure - Never Smoker  . Smokeless tobacco: Never Used  . Alcohol Use: Yes  . Drug Use: No  . Sexual Activity: Not Currently    Partners: Male   Other Topics Concern  . Not on file   Social History Narrative  . No narrative on file   Health Maintenance  Topic Date Due  . Influenza Vaccine  08/05/2013  . Mammogram  01/07/2014  . Pap Smear  08/25/2015  . Tetanus/tdap  04/24/2018  . Colonoscopy  06/21/2018    The following portions of the patient's history were reviewed and updated as appropriate:  She  has a past medical history of Hypertension; Hyperlipidemia; Allergy; and Uterine fibroid. She  does not have any pertinent problems on file. She  has past surgical history that includes Eye surgery (2005); Knee surgery (2002); and Breast cyst excision. Her family history includes Alcohol abuse in her father; Diabetes in her maternal aunt and mother; Hypertension in her mother; Stomach cancer in her father. She  reports that she has been passively smoking.  She has never used smokeless tobacco. She reports that she drinks alcohol. She reports that she does not use illicit drugs. She has a current medication list which includes the following prescription(s): amlodipine-benazepril, cetirizine, hydrocodone-acetaminophen, ibandronate, meloxicam, multivitamin, nasonex, pravastatin, and prempro. Current Outpatient Prescriptions on File Prior to Visit  Medication Sig Dispense Refill  . amLODipine-benazepril (LOTREL) 5-10 MG per capsule TAKE 1 CAPSULE BY MOUTH DAILY.  90 capsule  1  . cetirizine (ZYRTEC) 10 MG tablet Take 10 mg  by mouth daily.        Marland Kitchen HYDROcodone-acetaminophen (NORCO/VICODIN) 5-325 MG per tablet Take 0.5 tablets by mouth daily.      . Multiple Vitamin (MULTIVITAMIN) tablet Take 1 tablet by mouth daily.      Marland Kitchen NASONEX 50 MCG/ACT nasal spray PLACE 2 SPRAYS INTO THE NOSE DAILY.  17 g  4  . pravastatin (PRAVACHOL) 40 MG tablet TAKE 1 TAB BY MOUTH DAILY--LABS ARE DUE NOW  30 tablet  2  . PREMPRO 0.3-1.5 MG per tablet Take 1 tablet by mouth daily.        No current facility-administered medications on file prior to visit.   She has No Known Allergies..  Review of Systems  Review of Systems  Constitutional: Negative for activity change, appetite change and fatigue.  HENT: Negative for hearing loss, congestion, tinnitus and ear discharge.  dentist q45m Eyes: Negative for visual disturbance (see optho q1y -- vision corrected to 20/20 with glasses).  Respiratory: Negative for cough, chest tightness and shortness of breath.   Cardiovascular: Negative for chest pain, palpitations and leg swelling.  Gastrointestinal: Negative for abdominal pain, diarrhea, constipation and abdominal distention.  Genitourinary: Negative for urgency, frequency, decreased urine volume and difficulty urinating.  Musculoskeletal: Negative for back pain, arthralgias and gait problem.  Skin: Negative for color change, pallor and rash.  Neurological: Negative for dizziness, light-headedness, numbness and headaches.  Hematological: Negative for adenopathy. Does not bruise/bleed easily.  Psychiatric/Behavioral: Negative for suicidal ideas, confusion, sleep disturbance, self-injury,  dysphoric mood, decreased concentration and agitation.       Objective:    BP 120/86  Pulse 72  Temp(Src) 98.4 F (36.9 C) (Oral)  Resp 16  Ht 5' 1.5" (1.562 m)  Wt 146 lb 8 oz (66.452 kg)  BMI 27.24 kg/m2  SpO2 98% General appearance: alert, cooperative, appears stated age and no distress Head: Normocephalic, without obvious abnormality,  atraumatic Eyes: conjunctivae/corneas clear. PERRL, EOM's intact. Fundi benign. Ears: normal TM's and external ear canals both ears Nose: Nares normal. Septum midline. Mucosa normal. No drainage or sinus tenderness. Throat: lips, mucosa, and tongue normal; teeth and gums normal Neck: no adenopathy, no carotid bruit, no JVD, supple, symmetrical, trachea midline and thyroid not enlarged, symmetric, no tenderness/mass/nodules Back: symmetric, no curvature. ROM normal. No CVA tenderness. Lungs: clear to auscultation bilaterally Breasts: gyn Heart: S1, S2 normal Abdomen: soft, non-tender; bowel sounds normal; no masses,  no organomegaly Pelvic: deferred--gyn  Extremities: extremities normal, atraumatic, no cyanosis or edema Pulses: 2+ and symmetric Skin: Skin color, texture, turgor normal. No rashes or lesions Lymph nodes: Cervical, supraclavicular, and axillary nodes normal. Neurologic: Alert and oriented X 3, normal strength and tone. Normal symmetric reflexes. Normal coordination and gait Psych-- no depression, no anxiety      Assessment:    Healthy female exam.     Plan:     ghm utd Check labs  See After Visit Summary for Counseling Recommendations   1. Osteopenia  - ibandronate (BONIVA) 150 MG tablet; Take 1 tablet (150 mg total) by mouth every 30 (thirty) days.  Dispense: 3 tablet; Refill: 3 - Basic Metabolic Panel (BMET) - CBC w/Diff - Hepatic function panel - Lipid Profile - TSH  2. Osteoarthritis of hand Stable con't meds  - ibandronate (BONIVA) 150 MG tablet; Take 1 tablet (150 mg total) by mouth every 30 (thirty) days.  Dispense: 3 tablet; Refill: 3 - meloxicam (MOBIC) 15 MG tablet; Take 1 tablet (15 mg total) by mouth daily.  Dispense: 90 tablet; Refill: 1 - Basic Metabolic Panel (BMET) - CBC w/Diff - Hepatic function panel - Lipid Profile - TSH  3. Other and unspecified hyperlipidemia  - Hepatic function panel - Lipid panel - POCT urinalysis dipstick -  TSH - Basic Metabolic Panel (BMET) - CBC w/Diff - Hepatic function panel - Lipid Profile - TSH  4. HTN (hypertension) stable - Basic metabolic panel - CBC with Differential - POCT urinalysis dipstick - TSH - Basic Metabolic Panel (BMET) - CBC w/Diff - Hepatic function panel - Lipid Profile - TSH  5. Preventative health care  - Basic Metabolic Panel (BMET) - CBC w/Diff - Hepatic function panel - Lipid Profile - TSH  6. Urinary tract infection, site not specified  - Urine Culture

## 2013-02-20 NOTE — Assessment & Plan Note (Signed)
con't boniva Recheck 2 years

## 2013-02-22 ENCOUNTER — Telehealth: Payer: Self-pay | Admitting: Family Medicine

## 2013-02-22 LAB — URINE CULTURE

## 2013-02-22 NOTE — Telephone Encounter (Signed)
Relevant patient education assigned to patient using Emmi. ° °

## 2013-03-03 ENCOUNTER — Encounter: Payer: Self-pay | Admitting: Family Medicine

## 2013-05-04 ENCOUNTER — Other Ambulatory Visit: Payer: Self-pay | Admitting: Family Medicine

## 2013-06-20 ENCOUNTER — Other Ambulatory Visit: Payer: Self-pay | Admitting: Family Medicine

## 2013-07-19 ENCOUNTER — Other Ambulatory Visit: Payer: Self-pay | Admitting: Family Medicine

## 2013-08-10 ENCOUNTER — Other Ambulatory Visit: Payer: Self-pay | Admitting: Family Medicine

## 2013-08-17 ENCOUNTER — Encounter: Payer: Self-pay | Admitting: Family Medicine

## 2013-08-17 ENCOUNTER — Ambulatory Visit (INDEPENDENT_AMBULATORY_CARE_PROVIDER_SITE_OTHER): Payer: BC Managed Care – PPO | Admitting: Family Medicine

## 2013-08-17 VITALS — BP 126/80 | HR 82 | Temp 98.3°F | Wt 152.0 lb

## 2013-08-17 DIAGNOSIS — I10 Essential (primary) hypertension: Secondary | ICD-10-CM

## 2013-08-17 DIAGNOSIS — E785 Hyperlipidemia, unspecified: Secondary | ICD-10-CM

## 2013-08-17 MED ORDER — PRAVASTATIN SODIUM 40 MG PO TABS: ORAL_TABLET | ORAL | Status: DC

## 2013-08-17 MED ORDER — HYDROCODONE-ACETAMINOPHEN 5-325 MG PO TABS: 0.5000 | ORAL_TABLET | Freq: Four times a day (QID) | ORAL | Status: DC | PRN

## 2013-08-17 NOTE — Progress Notes (Signed)
  Subjective:    Patient here for follow-up of elevated blood pressure.  She is exercising and is adherent to a low-salt diet.  Blood pressure is well controlled at home. Cardiac symptoms: none. Patient denies: chest pain, chest pressure/discomfort, claudication, dyspnea, exertional chest pressure/discomfort, fatigue, irregular heart beat, lower extremity edema, near-syncope, orthopnea, palpitations, paroxysmal nocturnal dyspnea, syncope and tachypnea. Cardiovascular risk factors: dyslipidemia and hypertension. Use of agents associated with hypertension: none. History of target organ damage: none.  The following portions of the patient's history were reviewed and updated as appropriate: allergies, current medications, past family history, past medical history, past social history, past surgical history and problem list.  Review of Systems Pertinent items are noted in HPI.     Objective:    BP 126/80  Pulse 82  Temp(Src) 98.3 F (36.8 C) (Oral)  Wt 152 lb (68.947 kg)  SpO2 98% General appearance: alert, cooperative, appears stated age and no distress Throat: lips, mucosa, and tongue normal; teeth and gums normal Neck: no adenopathy, no carotid bruit, no JVD, supple, symmetrical, trachea midline and thyroid not enlarged, symmetric, no tenderness/mass/nodules Lungs: clear to auscultation bilaterally Heart: S1, S2 normal Extremities: extremities normal, atraumatic, no cyanosis or edema    Assessment:    Hypertension, normal blood pressure . Evidence of target organ damage: none.    Plan:    Medication: no change. Dietary sodium restriction. Check blood pressures 2-3 times weekly and record. Follow up: 6 months and as needed.   1. Other and unspecified hyperlipidemia Check labs, cont meds - pravastatin (PRAVACHOL) 40 MG tablet; TAKE 1 TAB BY MOUTH DAILy  Dispense: 90 tablet; Refill: 1 - Lipid panel - Hepatic function panel - Basic Metabolic Panel (BMET)  2. Essential  hypertension Check labs, stable - Lipid panel - Hepatic function panel - Basic Metabolic Panel (BMET)

## 2013-08-17 NOTE — Patient Instructions (Signed)

## 2013-08-17 NOTE — Progress Notes (Signed)
Pre visit review using our clinic review tool, if applicable. No additional management support is needed unless otherwise documented below in the visit note. 

## 2013-08-18 LAB — LIPID PANEL
CHOLESTEROL: 120 mg/dL (ref 0–200)
HDL: 54 mg/dL (ref >39)
LDL Cholesterol: 56 mg/dL (ref 0–99)
Total CHOL/HDL Ratio: 2.2 Ratio
Triglycerides: 52 mg/dL (ref <150)
VLDL: 10 mg/dL (ref 0–40)

## 2013-08-18 LAB — HEPATIC FUNCTION PANEL
ALK PHOS: 68 U/L (ref 39–117)
ALT: 19 U/L (ref 0–35)
AST: 21 U/L (ref 0–37)
Albumin: 4.2 g/dL (ref 3.5–5.2)
BILIRUBIN TOTAL: 0.4 mg/dL (ref 0.2–1.2)
Bilirubin, Direct: 0.1 mg/dL (ref 0.0–0.3)
Indirect Bilirubin: 0.3 mg/dL (ref 0.2–1.2)
Total Protein: 6.7 g/dL (ref 6.0–8.3)

## 2013-08-18 LAB — BASIC METABOLIC PANEL WITH GFR
BUN: 13 mg/dL (ref 6–23)
CO2: 24 meq/L (ref 19–32)
Calcium: 9.2 mg/dL (ref 8.4–10.5)
Chloride: 104 meq/L (ref 96–112)
Creat: 0.65 mg/dL (ref 0.50–1.10)
Glucose, Bld: 81 mg/dL (ref 70–99)
Potassium: 4.4 meq/L (ref 3.5–5.3)
Sodium: 139 meq/L (ref 135–145)

## 2013-08-25 ENCOUNTER — Other Ambulatory Visit: Payer: Self-pay | Admitting: Family Medicine

## 2013-10-19 ENCOUNTER — Other Ambulatory Visit: Payer: Self-pay

## 2013-10-19 ENCOUNTER — Telehealth: Payer: Self-pay | Admitting: Family Medicine

## 2013-10-19 MED ORDER — MELOXICAM 15 MG PO TABS: 15.0000 mg | ORAL_TABLET | Freq: Every day | ORAL | Status: DC

## 2013-10-19 NOTE — Telephone Encounter (Signed)
Caller name: Jerilyn Relation to pt: self Call back number: [phone] [facility] [address]  Reason for call:   Patient is requesting a refill of meloxicam. 90 day supply

## 2013-10-23 NOTE — Telephone Encounter (Signed)
Rx sent on 10/15 #90 with 1 refill.   KP

## 2013-11-01 ENCOUNTER — Telehealth: Payer: Self-pay | Admitting: Family Medicine

## 2013-11-01 NOTE — Telephone Encounter (Signed)
Caller name: Renisha Relation to pt: Call back number:[phone]   Reason for call:  Pt's orthopedic MD wants pt to do Vit B3 and Calcium level labs here at our office.  Can we get this ordered?

## 2013-11-01 NOTE — Telephone Encounter (Signed)
Please advise 

## 2013-11-02 NOTE — Telephone Encounter (Signed)
b3 or D3? Ok to order

## 2013-11-02 NOTE — Telephone Encounter (Signed)
Pt was appreciative of the call but states that she changed her mind and will have her labs drawn at a [facility] instead.

## 2013-11-10 ENCOUNTER — Telehealth: Payer: Self-pay

## 2013-11-10 MED ORDER — AMLODIPINE BESY-BENAZEPRIL HCL 5-10 MG PO CAPS: ORAL_CAPSULE | ORAL | Status: DC

## 2013-11-10 NOTE — Telephone Encounter (Signed)
Rx faxed for BP med's.     KP

## 2014-02-19 ENCOUNTER — Other Ambulatory Visit: Payer: Self-pay

## 2014-02-19 MED ORDER — PRAVASTATIN SODIUM 40 MG PO TABS: 40.0000 mg | ORAL_TABLET | Freq: Every day | ORAL | Status: DC

## 2014-04-24 ENCOUNTER — Telehealth: Payer: Self-pay | Admitting: Family Medicine

## 2014-04-24 NOTE — Telephone Encounter (Signed)
Pre Visit letter sent  °

## 2014-05-14 ENCOUNTER — Ambulatory Visit (INDEPENDENT_AMBULATORY_CARE_PROVIDER_SITE_OTHER): Payer: BLUE CROSS/BLUE SHIELD | Admitting: Family Medicine

## 2014-05-14 ENCOUNTER — Encounter: Payer: Self-pay | Admitting: Family Medicine

## 2014-05-14 VITALS — BP 134/84 | HR 68 | Temp 98.0°F | Ht 61.0 in | Wt 154.1 lb

## 2014-05-14 DIAGNOSIS — Z0000 Encounter for general adult medical examination without abnormal findings: Secondary | ICD-10-CM | POA: Diagnosis not present

## 2014-05-14 DIAGNOSIS — M1990 Unspecified osteoarthritis, unspecified site: Secondary | ICD-10-CM

## 2014-05-14 DIAGNOSIS — M8580 Other specified disorders of bone density and structure, unspecified site: Secondary | ICD-10-CM | POA: Diagnosis not present

## 2014-05-14 DIAGNOSIS — I10 Essential (primary) hypertension: Secondary | ICD-10-CM | POA: Diagnosis not present

## 2014-05-14 LAB — LIPID PANEL
CHOL/HDL RATIO: 2
CHOLESTEROL: 151 mg/dL (ref 0–200)
HDL: 66.3 mg/dL (ref >39.00)
LDL CALC: 74 mg/dL (ref 0–99)
NONHDL: 84.7
Triglycerides: 56 mg/dL (ref 0.0–149.0)
VLDL: 11.2 mg/dL (ref 0.0–40.0)

## 2014-05-14 LAB — CBC WITH DIFFERENTIAL/PLATELET
BASOS ABS: 0 10*3/uL (ref 0.0–0.1)
Basophils Relative: 0.5 % (ref 0.0–3.0)
EOS ABS: 0.4 10*3/uL (ref 0.0–0.7)
Eosinophils Relative: 4.9 % (ref 0.0–5.0)
HEMATOCRIT: 42.7 % (ref 36.0–46.0)
HEMOGLOBIN: 14.6 g/dL (ref 12.0–15.0)
Lymphocytes Relative: 20.2 % (ref 12.0–46.0)
Lymphs Abs: 1.5 10*3/uL (ref 0.7–4.0)
MCHC: 34.3 g/dL (ref 30.0–36.0)
MCV: 84.5 fl (ref 78.0–100.0)
Monocytes Absolute: 0.5 10*3/uL (ref 0.1–1.0)
Monocytes Relative: 6.6 % (ref 3.0–12.0)
Neutro Abs: 5.1 10*3/uL (ref 1.4–7.7)
Neutrophils Relative %: 67.8 % (ref 43.0–77.0)
Platelets: 303 10*3/uL (ref 150.0–400.0)
RBC: 5.05 Mil/uL (ref 3.87–5.11)
RDW: 14.3 % (ref 11.5–15.5)
WBC: 7.6 10*3/uL (ref 4.0–10.5)

## 2014-05-14 LAB — BASIC METABOLIC PANEL WITH GFR
BUN: 11 mg/dL (ref 6–23)
CO2: 22 meq/L (ref 19–32)
Calcium: 8.7 mg/dL (ref 8.4–10.5)
Chloride: 107 meq/L (ref 96–112)
Creatinine, Ser: 0.71 mg/dL (ref 0.40–1.20)
GFR: 109.08 mL/min
Glucose, Bld: 85 mg/dL (ref 70–99)
Potassium: 3.6 meq/L (ref 3.5–5.1)
Sodium: 137 meq/L (ref 135–145)

## 2014-05-14 LAB — HEPATIC FUNCTION PANEL
ALBUMIN: 4 g/dL (ref 3.5–5.2)
ALT: 16 U/L (ref 0–35)
AST: 19 U/L (ref 0–37)
Alkaline Phosphatase: 66 U/L (ref 39–117)
Bilirubin, Direct: 0.1 mg/dL (ref 0.0–0.3)
Total Bilirubin: 0.3 mg/dL (ref 0.2–1.2)
Total Protein: 7.1 g/dL (ref 6.0–8.3)

## 2014-05-14 LAB — MICROALBUMIN / CREATININE URINE RATIO
CREATININE, U: 92.2 mg/dL
Microalb Creat Ratio: 0.8 mg/g (ref 0.0–30.0)

## 2014-05-14 LAB — TSH: TSH: 0.81 u[IU]/mL (ref 0.35–4.50)

## 2014-05-14 MED ORDER — PRAVASTATIN SODIUM 40 MG PO TABS: 40.0000 mg | ORAL_TABLET | Freq: Every day | ORAL | Status: DC

## 2014-05-14 MED ORDER — AMLODIPINE BESY-BENAZEPRIL HCL 5-10 MG PO CAPS
ORAL_CAPSULE | ORAL | Status: DC
Start: 2014-05-14 — End: 2014-12-05

## 2014-05-14 MED ORDER — IBANDRONATE SODIUM 150 MG PO TABS: 150.0000 mg | ORAL_TABLET | ORAL | Status: DC

## 2014-05-14 NOTE — Patient Instructions (Signed)
Preventive Care for Adults A healthy lifestyle and preventive care can promote health and wellness. Preventive health guidelines for women include the following key practices.  A routine yearly physical is a good way to check with your health care provider about your health and preventive screening. It is a chance to share any concerns and updates on your health and to receive a thorough exam.  Visit your dentist for a routine exam and preventive care every 6 months. Brush your teeth twice a day and floss once a day. Good oral hygiene prevents tooth decay and gum disease.  The frequency of eye exams is based on your age, health, family medical history, use of contact lenses, and other factors. Follow your health care provider's recommendations for frequency of eye exams.  Eat a healthy diet. Foods like vegetables, fruits, whole grains, low-fat dairy products, and lean protein foods contain the nutrients you need without too many calories. Decrease your intake of foods high in solid fats, added sugars, and salt. Eat the right amount of calories for you.Get information about a proper diet from your health care provider, if necessary.  Regular physical exercise is one of the most important things you can do for your health. Most adults should get at least 150 minutes of moderate-intensity exercise (any activity that increases your heart rate and causes you to sweat) each week. In addition, most adults need muscle-strengthening exercises on 2 or more days a week.  Maintain a healthy weight. The body mass index (BMI) is a screening tool to identify possible weight problems. It provides an estimate of body fat based on height and weight. Your health care provider can find your BMI and can help you achieve or maintain a healthy weight.For adults 20 years and older:  A BMI below 18.5 is considered underweight.  A BMI of 18.5 to 24.9 is normal.  A BMI of 25 to 29.9 is considered overweight.  A BMI of  30 and above is considered obese.  Maintain normal blood lipids and cholesterol levels by exercising and minimizing your intake of saturated fat. Eat a balanced diet with plenty of fruit and vegetables. Blood tests for lipids and cholesterol should begin at age 76 and be repeated every 5 years. If your lipid or cholesterol levels are high, you are over 50, or you are at high risk for heart disease, you may need your cholesterol levels checked more frequently.Ongoing high lipid and cholesterol levels should be treated with medicines if diet and exercise are not working.  If you smoke, find out from your health care provider how to quit. If you do not use tobacco, do not start.  Lung cancer screening is recommended for adults aged 22-80 years who are at high risk for developing lung cancer because of a history of smoking. A yearly low-dose CT scan of the lungs is recommended for people who have at least a 30-pack-year history of smoking and are a current smoker or have quit within the past 15 years. A pack year of smoking is smoking an average of 1 pack of cigarettes a day for 1 year (for example: 1 pack a day for 30 years or 2 packs a day for 15 years). Yearly screening should continue until the smoker has stopped smoking for at least 15 years. Yearly screening should be stopped for people who develop a health problem that would prevent them from having lung cancer treatment.  If you are pregnant, do not drink alcohol. If you are breastfeeding,  be very cautious about drinking alcohol. If you are not pregnant and choose to drink alcohol, do not have more than 1 drink per day. One drink is considered to be 12 ounces (355 mL) of beer, 5 ounces (148 mL) of wine, or 1.5 ounces (44 mL) of liquor.  Avoid use of street drugs. Do not share needles with anyone. Ask for help if you need support or instructions about stopping the use of drugs.  High blood pressure causes heart disease and increases the risk of  stroke. Your blood pressure should be checked at least every 1 to 2 years. Ongoing high blood pressure should be treated with medicines if weight loss and exercise do not work.  If you are 75-52 years old, ask your health care provider if you should take aspirin to prevent strokes.  Diabetes screening involves taking a blood sample to check your fasting blood sugar level. This should be done once every 3 years, after age 15, if you are within normal weight and without risk factors for diabetes. Testing should be considered at a younger age or be carried out more frequently if you are overweight and have at least 1 risk factor for diabetes.  Breast cancer screening is essential preventive care for women. You should practice "breast self-awareness." This means understanding the normal appearance and feel of your breasts and may include breast self-examination. Any changes detected, no matter how small, should be reported to a health care provider. Women in their 58s and 30s should have a clinical breast exam (CBE) by a health care provider as part of a regular health exam every 1 to 3 years. After age 16, women should have a CBE every year. Starting at age 53, women should consider having a mammogram (breast X-ray test) every year. Women who have a family history of breast cancer should talk to their health care provider about genetic screening. Women at a high risk of breast cancer should talk to their health care providers about having an MRI and a mammogram every year.  Breast cancer gene (BRCA)-related cancer risk assessment is recommended for women who have family members with BRCA-related cancers. BRCA-related cancers include breast, ovarian, tubal, and peritoneal cancers. Having family members with these cancers may be associated with an increased risk for harmful changes (mutations) in the breast cancer genes BRCA1 and BRCA2. Results of the assessment will determine the need for genetic counseling and  BRCA1 and BRCA2 testing.  Routine pelvic exams to screen for cancer are no longer recommended for nonpregnant women who are considered low risk for cancer of the pelvic organs (ovaries, uterus, and vagina) and who do not have symptoms. Ask your health care provider if a screening pelvic exam is right for you.  If you have had past treatment for cervical cancer or a condition that could lead to cancer, you need Pap tests and screening for cancer for at least 20 years after your treatment. If Pap tests have been discontinued, your risk factors (such as having a new sexual partner) need to be reassessed to determine if screening should be resumed. Some women have medical problems that increase the chance of getting cervical cancer. In these cases, your health care provider may recommend more frequent screening and Pap tests.  The HPV test is an additional test that may be used for cervical cancer screening. The HPV test looks for the virus that can cause the cell changes on the cervix. The cells collected during the Pap test can be  tested for HPV. The HPV test could be used to screen women aged 30 years and older, and should be used in women of any age who have unclear Pap test results. After the age of 30, women should have HPV testing at the same frequency as a Pap test.  Colorectal cancer can be detected and often prevented. Most routine colorectal cancer screening begins at the age of 50 years and continues through age 75 years. However, your health care provider may recommend screening at an earlier age if you have risk factors for colon cancer. On a yearly basis, your health care provider may provide home test kits to check for hidden blood in the stool. Use of a small camera at the end of a tube, to directly examine the colon (sigmoidoscopy or colonoscopy), can detect the earliest forms of colorectal cancer. Talk to your health care provider about this at age 50, when routine screening begins. Direct  exam of the colon should be repeated every 5-10 years through age 75 years, unless early forms of pre-cancerous polyps or small growths are found.  People who are at an increased risk for hepatitis B should be screened for this virus. You are considered at high risk for hepatitis B if:  You were born in a country where hepatitis B occurs often. Talk with your health care provider about which countries are considered high risk.  Your parents were born in a high-risk country and you have not received a shot to protect against hepatitis B (hepatitis B vaccine).  You have HIV or AIDS.  You use needles to inject street drugs.  You live with, or have sex with, someone who has hepatitis B.  You get hemodialysis treatment.  You take certain medicines for conditions like cancer, organ transplantation, and autoimmune conditions.  Hepatitis C blood testing is recommended for all people born from 1945 through 1965 and any individual with known risks for hepatitis C.  Practice safe sex. Use condoms and avoid high-risk sexual practices to reduce the spread of sexually transmitted infections (STIs). STIs include gonorrhea, chlamydia, syphilis, trichomonas, herpes, HPV, and human immunodeficiency virus (HIV). Herpes, HIV, and HPV are viral illnesses that have no cure. They can result in disability, cancer, and death.  You should be screened for sexually transmitted illnesses (STIs) including gonorrhea and chlamydia if:  You are sexually active and are younger than 24 years.  You are older than 24 years and your health care provider tells you that you are at risk for this type of infection.  Your sexual activity has changed since you were last screened and you are at an increased risk for chlamydia or gonorrhea. Ask your health care provider if you are at risk.  If you are at risk of being infected with HIV, it is recommended that you take a prescription medicine daily to prevent HIV infection. This is  called preexposure prophylaxis (PrEP). You are considered at risk if:  You are a heterosexual woman, are sexually active, and are at increased risk for HIV infection.  You take drugs by injection.  You are sexually active with a partner who has HIV.  Talk with your health care provider about whether you are at high risk of being infected with HIV. If you choose to begin PrEP, you should first be tested for HIV. You should then be tested every 3 months for as long as you are taking PrEP.  Osteoporosis is a disease in which the bones lose minerals and strength   with aging. This can result in serious bone fractures or breaks. The risk of osteoporosis can be identified using a bone density scan. Women ages 65 years and over and women at risk for fractures or osteoporosis should discuss screening with their health care providers. Ask your health care provider whether you should take a calcium supplement or vitamin D to reduce the rate of osteoporosis.  Menopause can be associated with physical symptoms and risks. Hormone replacement therapy is available to decrease symptoms and risks. You should talk to your health care provider about whether hormone replacement therapy is right for you.  Use sunscreen. Apply sunscreen liberally and repeatedly throughout the day. You should seek shade when your shadow is shorter than you. Protect yourself by wearing long sleeves, pants, a wide-brimmed hat, and sunglasses year round, whenever you are outdoors.  Once a month, do a whole body skin exam, using a mirror to look at the skin on your back. Tell your health care provider of new moles, moles that have irregular borders, moles that are larger than a pencil eraser, or moles that have changed in shape or color.  Stay current with required vaccines (immunizations).  Influenza vaccine. All adults should be immunized every year.  Tetanus, diphtheria, and acellular pertussis (Td, Tdap) vaccine. Pregnant women should  receive 1 dose of Tdap vaccine during each pregnancy. The dose should be obtained regardless of the length of time since the last dose. Immunization is preferred during the 27th-36th week of gestation. An adult who has not previously received Tdap or who does not know her vaccine status should receive 1 dose of Tdap. This initial dose should be followed by tetanus and diphtheria toxoids (Td) booster doses every 10 years. Adults with an unknown or incomplete history of completing a 3-dose immunization series with Td-containing vaccines should begin or complete a primary immunization series including a Tdap dose. Adults should receive a Td booster every 10 years.  Varicella vaccine. An adult without evidence of immunity to varicella should receive 2 doses or a second dose if she has previously received 1 dose. Pregnant females who do not have evidence of immunity should receive the first dose after pregnancy. This first dose should be obtained before leaving the health care facility. The second dose should be obtained 4-8 weeks after the first dose.  Human papillomavirus (HPV) vaccine. Females aged 13-26 years who have not received the vaccine previously should obtain the 3-dose series. The vaccine is not recommended for use in pregnant females. However, pregnancy testing is not needed before receiving a dose. If a female is found to be pregnant after receiving a dose, no treatment is needed. In that case, the remaining doses should be delayed until after the pregnancy. Immunization is recommended for any person with an immunocompromised condition through the age of 26 years if she did not get any or all doses earlier. During the 3-dose series, the second dose should be obtained 4-8 weeks after the first dose. The third dose should be obtained 24 weeks after the first dose and 16 weeks after the second dose.  Zoster vaccine. One dose is recommended for adults aged 60 years or older unless certain conditions are  present.  Measles, mumps, and rubella (MMR) vaccine. Adults born before 1957 generally are considered immune to measles and mumps. Adults born in 1957 or later should have 1 or more doses of MMR vaccine unless there is a contraindication to the vaccine or there is laboratory evidence of immunity to   each of the three diseases. A routine second dose of MMR vaccine should be obtained at least 28 days after the first dose for students attending postsecondary schools, health care workers, or international travelers. People who received inactivated measles vaccine or an unknown type of measles vaccine during 1963-1967 should receive 2 doses of MMR vaccine. People who received inactivated mumps vaccine or an unknown type of mumps vaccine before 1979 and are at high risk for mumps infection should consider immunization with 2 doses of MMR vaccine. For females of childbearing age, rubella immunity should be determined. If there is no evidence of immunity, females who are not pregnant should be vaccinated. If there is no evidence of immunity, females who are pregnant should delay immunization until after pregnancy. Unvaccinated health care workers born before [birth year] who lack laboratory evidence of measles, mumps, or rubella immunity or laboratory confirmation of disease should consider measles and mumps immunization with 2 doses of MMR vaccine or rubella immunization with 1 dose of MMR vaccine.  Pneumococcal 13-valent conjugate (PCV13) vaccine. When indicated, a person who is uncertain of her immunization history and has no record of immunization should receive the PCV13 vaccine. An adult aged 50 years or older who has certain medical conditions and has not been previously immunized should receive 1 dose of PCV13 vaccine. This PCV13 should be followed with a dose of pneumococcal polysaccharide (PPSV23) vaccine. The PPSV23 vaccine dose should be obtained at least 8 weeks after the dose of PCV13 vaccine. An adult aged 79  years or older who has certain medical conditions and previously received 1 or more doses of PPSV23 vaccine should receive 1 dose of PCV13. The PCV13 vaccine dose should be obtained 1 or more years after the last PPSV23 vaccine dose.  Pneumococcal polysaccharide (PPSV23) vaccine. When PCV13 is also indicated, PCV13 should be obtained first. All adults aged 39 years and older should be immunized. An adult younger than age 16 years who has certain medical conditions should be immunized. Any person who resides in a nursing home or long-term care facility should be immunized. An adult smoker should be immunized. People with an immunocompromised condition and certain other conditions should receive both PCV13 and PPSV23 vaccines. People with human immunodeficiency virus (HIV) infection should be immunized as soon as possible after diagnosis. Immunization during chemotherapy or radiation therapy should be avoided. Routine use of PPSV23 vaccine is not recommended for American Indians, [place] Natives, or people younger than 65 years unless there are medical conditions that require PPSV23 vaccine. When indicated, people who have unknown immunization and have no record of immunization should receive PPSV23 vaccine. One-time revaccination 5 years after the first dose of PPSV23 is recommended for people aged 19-64 years who have chronic kidney failure, nephrotic syndrome, asplenia, or immunocompromised conditions. People who received 1-2 doses of PPSV23 before age 16 years should receive another dose of PPSV23 vaccine at age 29 years or later if at least 5 years have passed since the previous dose. Doses of PPSV23 are not needed for people immunized with PPSV23 at or after age 48 years.  Meningococcal vaccine. Adults with asplenia or persistent complement component deficiencies should receive 2 doses of quadrivalent meningococcal conjugate (MenACWY-D) vaccine. The doses should be obtained at least 2 months apart.  Microbiologists working with certain meningococcal bacteria, [place] recruits, people at risk during an outbreak, and people who travel to or live in countries with a high rate of meningitis should be immunized. A first-year college student up through age  21 years who is living in a residence hall should receive a dose if she did not receive a dose on or after her 16th birthday. Adults who have certain high-risk conditions should receive one or more doses of vaccine.  Hepatitis A vaccine. Adults who wish to be protected from this disease, have certain high-risk conditions, work with hepatitis A-infected animals, work in hepatitis A research labs, or travel to or work in countries with a high rate of hepatitis A should be immunized. Adults who were previously unvaccinated and who anticipate close contact with an international adoptee during the first 60 days after arrival in the [place] from a country with a high rate of hepatitis A should be immunized.  Hepatitis B vaccine. Adults who wish to be protected from this disease, have certain high-risk conditions, may be exposed to blood or other infectious body fluids, are household contacts or sex partners of hepatitis B positive people, are clients or workers in certain care facilities, or travel to or work in countries with a high rate of hepatitis B should be immunized.  Haemophilus influenzae type b (Hib) vaccine. A previously unvaccinated person with asplenia or sickle cell disease or having a scheduled splenectomy should receive 1 dose of Hib vaccine. Regardless of previous immunization, a recipient of a hematopoietic stem cell transplant should receive a 3-dose series 6-12 months after her successful transplant. Hib vaccine is not recommended for adults with HIV infection. Preventive Services / Frequency Ages 37 to 40 years  Blood pressure check.** / Every 1 to 2 years.  Lipid and cholesterol check.** / Every 5 years beginning at age  47.  Clinical breast exam.** / Every 3 years for women in their 52s and 88s.  BRCA-related cancer risk assessment.** / For women who have family members with a BRCA-related cancer (breast, ovarian, tubal, or peritoneal cancers).  Pap test.** / Every 2 years from ages 34 through 41. Every 3 years starting at age 65 through age 17 or 63 with a history of 3 consecutive normal Pap tests.  HPV screening.** / Every 3 years from ages 65 through ages 46 to 7 with a history of 3 consecutive normal Pap tests.  Hepatitis C blood test.** / For any individual with known risks for hepatitis C.  Skin self-exam. / Monthly.  Influenza vaccine. / Every year.  Tetanus, diphtheria, and acellular pertussis (Tdap, Td) vaccine.** / Consult your health care provider. Pregnant women should receive 1 dose of Tdap vaccine during each pregnancy. 1 dose of Td every 10 years.  Varicella vaccine.** / Consult your health care provider. Pregnant females who do not have evidence of immunity should receive the first dose after pregnancy.  HPV vaccine. / 3 doses over 6 months, if 15 and younger. The vaccine is not recommended for use in pregnant females. However, pregnancy testing is not needed before receiving a dose.  Measles, mumps, rubella (MMR) vaccine.** / You need at least 1 dose of MMR if you were born in 1957 or later. You may also need a 2nd dose. For females of childbearing age, rubella immunity should be determined. If there is no evidence of immunity, females who are not pregnant should be vaccinated. If there is no evidence of immunity, females who are pregnant should delay immunization until after pregnancy.  Pneumococcal 13-valent conjugate (PCV13) vaccine.** / Consult your health care provider.  Pneumococcal polysaccharide (PPSV23) vaccine.** / 1 to 2 doses if you smoke cigarettes or if you have certain conditions.  Meningococcal vaccine.** /  1 dose if you are age 19 to 21 years and a first-year college  student living in a residence hall, or have one of several medical conditions, you need to get vaccinated against meningococcal disease. You may also need additional booster doses.  Hepatitis A vaccine.** / Consult your health care provider.  Hepatitis B vaccine.** / Consult your health care provider.  Haemophilus influenzae type b (Hib) vaccine.** / Consult your health care provider. Ages 40 to 64 years  Blood pressure check.** / Every 1 to 2 years.  Lipid and cholesterol check.** / Every 5 years beginning at age 20 years.  Lung cancer screening. / Every year if you are aged 55-80 years and have a 30-pack-year history of smoking and currently smoke or have quit within the past 15 years. Yearly screening is stopped once you have quit smoking for at least 15 years or develop a health problem that would prevent you from having lung cancer treatment.  Clinical breast exam.** / Every year after age 40 years.  BRCA-related cancer risk assessment.** / For women who have family members with a BRCA-related cancer (breast, ovarian, tubal, or peritoneal cancers).  Mammogram.** / Every year beginning at age 40 years and continuing for as long as you are in good health. Consult with your health care provider.  Pap test.** / Every 3 years starting at age 30 years through age 65 or 70 years with a history of 3 consecutive normal Pap tests.  HPV screening.** / Every 3 years from ages 30 years through ages 65 to 70 years with a history of 3 consecutive normal Pap tests.  Fecal occult blood test (FOBT) of stool. / Every year beginning at age 50 years and continuing until age 75 years. You may not need to do this test if you get a colonoscopy every 10 years.  Flexible sigmoidoscopy or colonoscopy.** / Every 5 years for a flexible sigmoidoscopy or every 10 years for a colonoscopy beginning at age 50 years and continuing until age 75 years.  Hepatitis C blood test.** / For all people born from 1945 through  1965 and any individual with known risks for hepatitis C.  Skin self-exam. / Monthly.  Influenza vaccine. / Every year.  Tetanus, diphtheria, and acellular pertussis (Tdap/Td) vaccine.** / Consult your health care provider. Pregnant women should receive 1 dose of Tdap vaccine during each pregnancy. 1 dose of Td every 10 years.  Varicella vaccine.** / Consult your health care provider. Pregnant females who do not have evidence of immunity should receive the first dose after pregnancy.  Zoster vaccine.** / 1 dose for adults aged 60 years or older.  Measles, mumps, rubella (MMR) vaccine.** / You need at least 1 dose of MMR if you were born in 1957 or later. You may also need a 2nd dose. For females of childbearing age, rubella immunity should be determined. If there is no evidence of immunity, females who are not pregnant should be vaccinated. If there is no evidence of immunity, females who are pregnant should delay immunization until after pregnancy.  Pneumococcal 13-valent conjugate (PCV13) vaccine.** / Consult your health care provider.  Pneumococcal polysaccharide (PPSV23) vaccine.** / 1 to 2 doses if you smoke cigarettes or if you have certain conditions.  Meningococcal vaccine.** / Consult your health care provider.  Hepatitis A vaccine.** / Consult your health care provider.  Hepatitis B vaccine.** / Consult your health care provider.  Haemophilus influenzae type b (Hib) vaccine.** / Consult your health care provider. Ages 65   years and over  Blood pressure check.** / Every 1 to 2 years.  Lipid and cholesterol check.** / Every 5 years beginning at age 22 years.  Lung cancer screening. / Every year if you are aged 73-80 years and have a 30-pack-year history of smoking and currently smoke or have quit within the past 15 years. Yearly screening is stopped once you have quit smoking for at least 15 years or develop a health problem that would prevent you from having lung cancer  treatment.  Clinical breast exam.** / Every year after age 4 years.  BRCA-related cancer risk assessment.** / For women who have family members with a BRCA-related cancer (breast, ovarian, tubal, or peritoneal cancers).  Mammogram.** / Every year beginning at age 40 years and continuing for as long as you are in good health. Consult with your health care provider.  Pap test.** / Every 3 years starting at age 9 years through age 34 or 91 years with 3 consecutive normal Pap tests. Testing can be stopped between 65 and 70 years with 3 consecutive normal Pap tests and no abnormal Pap or HPV tests in the past 10 years.  HPV screening.** / Every 3 years from ages 57 years through ages 64 or 45 years with a history of 3 consecutive normal Pap tests. Testing can be stopped between 65 and 70 years with 3 consecutive normal Pap tests and no abnormal Pap or HPV tests in the past 10 years.  Fecal occult blood test (FOBT) of stool. / Every year beginning at age 15 years and continuing until age 17 years. You may not need to do this test if you get a colonoscopy every 10 years.  Flexible sigmoidoscopy or colonoscopy.** / Every 5 years for a flexible sigmoidoscopy or every 10 years for a colonoscopy beginning at age 86 years and continuing until age 71 years.  Hepatitis C blood test.** / For all people born from [birth year] through 1963 and any individual with known risks for hepatitis C.  Osteoporosis screening.** / A one-time screening for women ages 83 years and over and women at risk for fractures or osteoporosis.  Skin self-exam. / Monthly.  Influenza vaccine. / Every year.  Tetanus, diphtheria, and acellular pertussis (Tdap/Td) vaccine.** / 1 dose of Td every 10 years.  Varicella vaccine.** / Consult your health care provider.  Zoster vaccine.** / 1 dose for adults aged 61 years or older.  Pneumococcal 13-valent conjugate (PCV13) vaccine.** / Consult your health care provider.  Pneumococcal  polysaccharide (PPSV23) vaccine.** / 1 dose for all adults aged 28 years and older.  Meningococcal vaccine.** / Consult your health care provider.  Hepatitis A vaccine.** / Consult your health care provider.  Hepatitis B vaccine.** / Consult your health care provider.  Haemophilus influenzae type b (Hib) vaccine.** / Consult your health care provider. ** Family history and personal history of risk and conditions may change your health care provider's recommendations. Document Released: 02/17/2001 Document Revised: 05/08/2013 Document Reviewed: 05/19/2010 [facility] Patient Information 2015 [place], [place]. This information is not intended to replace advice given to you by your health care provider. Make sure you discuss any questions you have with your health care provider.

## 2014-05-14 NOTE — Progress Notes (Signed)
Pre visit review using our clinic review tool, if applicable. No additional management support is needed unless otherwise documented below in the visit note. 

## 2014-05-14 NOTE — Progress Notes (Signed)
Subjective:     Susan Torres is a 59 y.o. female and is here for a comprehensive physical exam. The patient reports no problems.  History   Social History  . Marital Status: Single    Spouse Name: N/A  . Number of Children: N/A  . Years of Education: N/A   Occupational History  . [employer]    Social History Main Topics  . Smoking status: Passive Smoke Exposure - Never Smoker  . Smokeless tobacco: Never Used  . Alcohol Use: Yes  . Drug Use: No  . Sexual Activity:    Partners: Male   Other Topics Concern  . Not on file   Social History Narrative   Health Maintenance  Topic Date Due  . HIV Screening  05/14/2015 (Originally 04/04/1970)  . INFLUENZA VACCINE  08/06/2014  . MAMMOGRAM  05/06/2016  . PAP SMEAR  04/24/2017  . TETANUS/TDAP  04/24/2018  . COLONOSCOPY  06/21/2018    The following portions of the patient's history were reviewed and updated as appropriate:  She  has a past medical history of Hypertension; Hyperlipidemia; Allergy; and Uterine fibroid. She  does not have any pertinent problems on file. She  has past surgical history that includes Eye surgery (2005); Knee surgery (2002); and Breast cyst excision. Her family history includes Alcohol abuse in her father; Diabetes in her maternal aunt and mother; Hypertension in her mother; Stomach cancer in her father. She  reports that she has been passively smoking.  She has never used smokeless tobacco. She reports that she drinks alcohol. She reports that she does not use illicit drugs. She has a current medication list which includes the following prescription(s): amlodipine-benazepril, cetirizine, diclofenac, hydrocodone-acetaminophen, ibandronate, multivitamin, nasonex, pravastatin, and prempro. Current Outpatient Prescriptions on File Prior to Visit  Medication Sig Dispense Refill  . cetirizine (ZYRTEC) 10 MG tablet Take 10 mg by mouth daily.      Marland Kitchen HYDROcodone-acetaminophen (NORCO/VICODIN) 5-325 MG per tablet  Take 0.5 tablets by mouth every 6 (six) hours as needed for moderate pain. 30 tablet 0  . Multiple Vitamin (MULTIVITAMIN) tablet Take 1 tablet by mouth daily.    Marland Kitchen NASONEX 50 MCG/ACT nasal spray PLACE 2 SPRAYS INTO THE NOSE DAILY. 17 g 2  . PREMPRO 0.3-1.5 MG per tablet Take 1 tablet by mouth daily.      No current facility-administered medications on file prior to visit.   She has No Known Allergies..  Review of Systems Review of Systems  Constitutional: Negative for activity change, appetite change and fatigue.  HENT: Negative for hearing loss, congestion, tinnitus and ear discharge.  dentist q43m Eyes: Negative for visual disturbance (see optho q1y -- vision corrected to 20/20 with glasses).  Respiratory: Negative for cough, chest tightness and shortness of breath.   Cardiovascular: Negative for chest pain, palpitations and leg swelling.  Gastrointestinal: Negative for abdominal pain, diarrhea, constipation and abdominal distention.  Genitourinary: Negative for urgency, frequency, decreased urine volume and difficulty urinating.  Musculoskeletal: Negative for back pain, arthralgias and gait problem.  Skin: Negative for color change, pallor and rash.  Neurological: Negative for dizziness, light-headedness, numbness and headaches.  Hematological: Negative for adenopathy. Does not bruise/bleed easily.  Psychiatric/Behavioral: Negative for suicidal ideas, confusion, sleep disturbance, self-injury, dysphoric mood, decreased concentration and agitation.       Objective:    BP 134/84 mmHg  Pulse 68  Temp(Src) 98 F (36.7 C) (Oral)  Ht 5\' 1"  (1.549 m)  Wt 154 lb 1.6 oz (69.899 kg)  BMI 29.13 kg/m2  SpO2 98% General appearance: alert, cooperative, appears stated age and no distress Head: Normocephalic, without obvious abnormality, atraumatic Eyes: conjunctivae/corneas clear. PERRL, EOM's intact. Fundi benign. Ears: normal TM's and external ear canals both ears Nose: Nares normal.  Septum midline. Mucosa normal. No drainage or sinus tenderness. Throat: lips, mucosa, and tongue normal; teeth and gums normal Neck: no adenopathy, no carotid bruit, no JVD, supple, symmetrical, trachea midline and thyroid not enlarged, symmetric, no tenderness/mass/nodules Back: symmetric, no curvature. ROM normal. No CVA tenderness. Lungs: clear to auscultation bilaterally Breasts: gyn Heart: regular rate and rhythm, S1, S2 normal, no murmur, click, rub or gallop Abdomen: soft, non-tender; bowel sounds normal; no masses,  no organomegaly Pelvic: deferred--gyn Extremities: extremities normal, atraumatic, no cyanosis or edema Pulses: 2+ and symmetric Skin: Skin color, texture, turgor normal. No rashes or lesions Lymph nodes: Cervical, supraclavicular, and axillary nodes normal. Neurologic: Alert and oriented X 3, normal strength and tone. Normal symmetric reflexes. Normal coordination and gait Psych-- no depression , no anxiety      Assessment:    Healthy female exam.      Plan:    ghm utd Check labs See After Visit Summary for Counseling Recommendations    1. Essential hypertension  stable - amLODipine-benazepril (LOTREL) 5-10 MG per capsule; 1 cap by mouth daily  Dispense: 90 capsule; Refill: 1 - pravastatin (PRAVACHOL) 40 MG tablet; Take 1 tablet (40 mg total) by mouth daily.  Dispense: 90 tablet; Refill: 0 - Basic metabolic panel  2. Osteopenia Pt will discuss bmd with ortho - ibandronate (BONIVA) 150 MG tablet; Take 1 tablet (150 mg total) by mouth every 30 (thirty) days.  Dispense: 3 tablet; Refill: 3 - Vitamin D 1,25 dihydroxy  3. Preventative health care  - HIV antibody - Basic metabolic panel - CBC with Differential/Platelet - Hepatic function panel - Lipid panel - Microalbumin / creatinine urine ratio - POCT urinalysis dipstick - TSH

## 2014-05-15 LAB — HIV ANTIBODY (ROUTINE TESTING W REFLEX)

## 2014-05-17 LAB — VITAMIN D 1,25 DIHYDROXY
VITAMIN D 1, 25 (OH) TOTAL: 43 pg/mL (ref 18–72)
Vitamin D2 1, 25 (OH)2: <8 pg/mL
Vitamin D3 1, 25 (OH)2: 43 pg/mL

## 2014-05-18 ENCOUNTER — Other Ambulatory Visit: Payer: BLUE CROSS/BLUE SHIELD

## 2014-05-18 DIAGNOSIS — Z0000 Encounter for general adult medical examination without abnormal findings: Secondary | ICD-10-CM

## 2014-05-19 LAB — HIV ANTIBODY (ROUTINE TESTING W REFLEX): HIV: NONREACTIVE

## 2014-05-24 LAB — POCT URINALYSIS DIPSTICK
Bilirubin, UA: NEGATIVE
Glucose, UA: NEGATIVE
Ketones, UA: NEGATIVE
Leukocytes, UA: NEGATIVE
Nitrite, UA: NEGATIVE
PROTEIN UA: NEGATIVE
RBC UA: NEGATIVE
SPEC GRAV UA: 1.025
UROBILINOGEN UA: 4
pH, UA: 5.5

## 2014-08-29 ENCOUNTER — Other Ambulatory Visit: Payer: Self-pay | Admitting: Family Medicine

## 2014-11-27 ENCOUNTER — Other Ambulatory Visit: Payer: Self-pay | Admitting: Family Medicine

## 2014-12-05 ENCOUNTER — Other Ambulatory Visit: Payer: Self-pay | Admitting: Family Medicine

## 2014-12-05 NOTE — Telephone Encounter (Signed)
meds filled, pt needs a BP and Cholesterol follow up.

## 2015-01-01 ENCOUNTER — Ambulatory Visit (INDEPENDENT_AMBULATORY_CARE_PROVIDER_SITE_OTHER): Payer: BLUE CROSS/BLUE SHIELD | Admitting: Family Medicine

## 2015-01-01 ENCOUNTER — Encounter: Payer: Self-pay | Admitting: Family Medicine

## 2015-01-01 VITALS — BP 122/58 | HR 65 | Temp 98.8°F | Ht 61.0 in | Wt 154.0 lb

## 2015-01-01 DIAGNOSIS — M8580 Other specified disorders of bone density and structure, unspecified site: Secondary | ICD-10-CM | POA: Diagnosis not present

## 2015-01-01 DIAGNOSIS — I10 Essential (primary) hypertension: Secondary | ICD-10-CM

## 2015-01-01 DIAGNOSIS — E785 Hyperlipidemia, unspecified: Secondary | ICD-10-CM | POA: Diagnosis not present

## 2015-01-01 DIAGNOSIS — E559 Vitamin D deficiency, unspecified: Secondary | ICD-10-CM | POA: Diagnosis not present

## 2015-01-01 DIAGNOSIS — R5383 Other fatigue: Secondary | ICD-10-CM | POA: Diagnosis not present

## 2015-01-01 DIAGNOSIS — Z1159 Encounter for screening for other viral diseases: Secondary | ICD-10-CM

## 2015-01-01 LAB — CBC WITH DIFFERENTIAL/PLATELET
BASOS ABS: 0 10*3/uL (ref 0.0–0.1)
BASOS PCT: 0 % (ref 0–1)
EOS PCT: 4 % (ref 0–5)
Eosinophils Absolute: 0.3 10*3/uL (ref 0.0–0.7)
HCT: 42.7 % (ref 36.0–46.0)
Hemoglobin: 14.3 g/dL (ref 12.0–15.0)
LYMPHS ABS: 1.8 10*3/uL (ref 0.7–4.0)
LYMPHS PCT: 22 % (ref 12–46)
MCH: 28.7 pg (ref 26.0–34.0)
MCHC: 33.5 g/dL (ref 30.0–36.0)
MCV: 85.6 fL (ref 78.0–100.0)
MPV: 9.9 fL (ref 8.6–12.4)
Monocytes Absolute: 0.5 10*3/uL (ref 0.1–1.0)
Monocytes Relative: 6 % (ref 3–12)
NEUTROS ABS: 5.5 10*3/uL (ref 1.7–7.7)
Neutrophils Relative %: 68 % (ref 43–77)
PLATELETS: 345 10*3/uL (ref 150–400)
RBC: 4.99 MIL/uL (ref 3.87–5.11)
RDW: 14.1 % (ref 11.5–15.5)
WBC: 8.1 10*3/uL (ref 4.0–10.5)

## 2015-01-01 LAB — LIPID PANEL
Cholesterol: 144 mg/dL (ref 125–200)
HDL: 68 mg/dL (ref >=46)
LDL CALC: 62 mg/dL (ref <130)
TRIGLYCERIDES: 68 mg/dL (ref <150)
Total CHOL/HDL Ratio: 2.1 Ratio (ref <=5.0)
VLDL: 14 mg/dL (ref <30)

## 2015-01-01 LAB — COMPREHENSIVE METABOLIC PANEL
ALT: 19 U/L (ref 6–29)
AST: 19 U/L (ref 10–35)
Albumin: 3.9 g/dL (ref 3.6–5.1)
Alkaline Phosphatase: 62 U/L (ref 33–130)
BUN: 13 mg/dL (ref 7–25)
CHLORIDE: 103 mmol/L (ref 98–110)
CO2: 24 mmol/L (ref 20–31)
CREATININE: 0.66 mg/dL (ref 0.50–1.05)
Calcium: 9.3 mg/dL (ref 8.6–10.4)
GLUCOSE: 80 mg/dL (ref 65–99)
Potassium: 4.1 mmol/L (ref 3.5–5.3)
SODIUM: 137 mmol/L (ref 135–146)
Total Bilirubin: 0.4 mg/dL (ref 0.2–1.2)
Total Protein: 6.7 g/dL (ref 6.1–8.1)

## 2015-01-01 MED ORDER — IBANDRONATE SODIUM 150 MG PO TABS: 150.0000 mg | ORAL_TABLET | ORAL | Status: DC

## 2015-01-01 MED ORDER — PRAVASTATIN SODIUM 40 MG PO TABS: ORAL_TABLET | ORAL | Status: DC

## 2015-01-01 MED ORDER — AMLODIPINE BESY-BENAZEPRIL HCL 5-10 MG PO CAPS: 1.0000 | ORAL_CAPSULE | Freq: Every day | ORAL | Status: DC

## 2015-01-01 NOTE — Progress Notes (Signed)
Patient ID: Susan Torres, female    DOB: [birth year]  Age: 59 y.o. MRN: [id number]    Subjective:  Subjective HPI Susan Torres presents for swelling in ankles from standing all day and c/o fatigue.    Review of Systems  Constitutional: Negative for diaphoresis, appetite change, fatigue and unexpected weight change.  Eyes: Negative for pain, redness and visual disturbance.  Respiratory: Negative for cough, chest tightness, shortness of breath and wheezing.   Cardiovascular: Negative for chest pain, palpitations and leg swelling.  Endocrine: Negative for cold intolerance, heat intolerance, polydipsia, polyphagia and polyuria.  Genitourinary: Negative for dysuria, frequency and difficulty urinating.  Neurological: Negative for dizziness, light-headedness, numbness and headaches.    History Past Medical History  Diagnosis Date  . Hypertension   . Hyperlipidemia   . Allergy   . Uterine fibroid     She has past surgical history that includes Eye surgery (2005); Knee surgery (2002); and Breast cyst excision.   Her family history includes Alcohol abuse in her father; Diabetes in her maternal aunt and mother; Hypertension in her mother; Stomach cancer in her father.She reports that she has been passively smoking.  She has never used smokeless tobacco. She reports that she drinks alcohol. She reports that she does not use illicit drugs.  Current Outpatient Prescriptions on File Prior to Visit  Medication Sig Dispense Refill  . cetirizine (ZYRTEC) 10 MG tablet Take 10 mg by mouth daily.      Marland Kitchen HYDROcodone-acetaminophen (NORCO/VICODIN) 5-325 MG per tablet Take 0.5 tablets by mouth every 6 (six) hours as needed for moderate pain. 30 tablet 0  . Multiple Vitamin (MULTIVITAMIN) tablet Take 1 tablet by mouth daily.    Marland Kitchen NASONEX 50 MCG/ACT nasal spray PLACE 2 SPRAYS INTO THE NOSE DAILY. 17 g 2  . PREMPRO 0.3-1.5 MG per tablet Take 1 tablet by mouth daily.      No current  facility-administered medications on file prior to visit.     Objective:  Objective Physical Exam  Constitutional: She is oriented to person, place, and time. She appears well-developed and well-nourished.  HENT:  Head: Normocephalic and atraumatic.  Eyes: Conjunctivae and EOM are normal.  Neck: Normal range of motion. Neck supple. No JVD present. Carotid bruit is not present. No thyromegaly present.  Cardiovascular: Normal rate, regular rhythm and normal heart sounds.   No murmur heard. Pulmonary/Chest: Effort normal and breath sounds normal. No respiratory distress. She has no wheezes. She has no rales. She exhibits no tenderness.  Musculoskeletal: She exhibits edema. She exhibits no tenderness.  Neurological: She is alert and oriented to person, place, and time.  Psychiatric: She has a normal mood and affect. Her behavior is normal.  Nursing note and vitals reviewed.  BP 122/58 mmHg  Pulse 65  Temp(Src) 98.8 F (37.1 C) (Oral)  Ht '5\' 1"'  (1.549 m)  Wt 154 lb (69.854 kg)  BMI 29.11 kg/m2  SpO2 98% Wt Readings from Last 3 Encounters:  01/01/15 154 lb (69.854 kg)  05/14/14 154 lb 1.6 oz (69.899 kg)  08/17/13 152 lb (68.947 kg)     Lab Results  Component Value Date   WBC 8.1 01/01/2015   HGB 14.3 01/01/2015   HCT 42.7 01/01/2015   PLT 345 01/01/2015   GLUCOSE 85 05/14/2014   CHOL 151 05/14/2014   TRIG 56.0 05/14/2014   HDL 66.30 05/14/2014   LDLCALC 74 05/14/2014   ALT 16 05/14/2014   AST 19 05/14/2014   NA 137 05/14/2014  K 3.6 05/14/2014   CL 107 05/14/2014   CREATININE 0.71 05/14/2014   BUN 11 05/14/2014   CO2 22 05/14/2014   TSH 0.81 05/14/2014   MICROALBUR <0.7 05/14/2014    Dg Bone Density  07/12/2012  The Bone Mineral Densitometry hard-copy report (which includes all data, graphical display, and FRAX results when applicable) has been sent directly to the ordering physician. This report can also be obtained electronically by viewing images for this exam  through the performing facility's EMR, or by logging directly into [employer]. Original Report Authenticated By: Ponciano Ort, M.D.     Assessment & Plan:  Plan I have discontinued Ms. Vargo's Diclofenac. I have also changed her amLODipine-benazepril. Additionally, I am having her maintain her PREMPRO, cetirizine, multivitamin, NASONEX, HYDROcodone-acetaminophen, celecoxib, ibandronate, and pravastatin.  Meds ordered this encounter  Medications  . celecoxib (CELEBREX) 200 MG capsule    Sig: Take 1 capsule by mouth 2 (two) times daily.    Refill:  3  . amLODipine-benazepril (LOTREL) 5-10 MG capsule    Sig: Take 1 capsule by mouth daily.    Dispense:  90 capsule    Refill:  1  . ibandronate (BONIVA) 150 MG tablet    Sig: Take 1 tablet (150 mg total) by mouth every 30 (thirty) days.    Dispense:  3 tablet    Refill:  3  . pravastatin (PRAVACHOL) 40 MG tablet    Sig: TAKE 1 TABLET (40 MG TOTAL) BY MOUTH DAILY.    Dispense:  90 tablet    Refill:  1    Problem List Items Addressed This Visit      Unprioritized   Osteopenia   Relevant Medications   ibandronate (BONIVA) 150 MG tablet    Other Visit Diagnoses    Vitamin D deficiency    -  Primary    Relevant Orders    Vitamin D 1,25 dihydroxy    Other fatigue        Relevant Orders    Vitamin D 1,25 dihydroxy    Vitamin B12 (Completed)    Hyperlipidemia        Relevant Medications    amLODipine-benazepril (LOTREL) 5-10 MG capsule    pravastatin (PRAVACHOL) 40 MG tablet    Other Relevant Orders    CBC w/Diff (Completed)    Comp Met (CMET) (Completed)    Lipid panel (Completed)    TSH (Completed)    Essential hypertension        Relevant Medications    amLODipine-benazepril (LOTREL) 5-10 MG capsule    pravastatin (PRAVACHOL) 40 MG tablet    Other Relevant Orders    CBC w/Diff (Completed)    Comp Met (CMET) (Completed)    Lipid panel (Completed)    TSH (Completed)    Need for hepatitis C screening test         Relevant Orders    Hepatitis C antibody       Follow-up: Return in about 6 months (around 07/02/2015), or if symptoms worsen or fail to improve, for annual exam, fasting.  Garnet Koyanagi, DO

## 2015-01-01 NOTE — Progress Notes (Signed)
Pre visit review using our clinic review tool, if applicable. No additional management support is needed unless otherwise documented below in the visit note. 

## 2015-01-01 NOTE — Patient Instructions (Signed)
Edema °Edema is an abnormal buildup of fluids in your body tissues. Edema is somewhat dependent on gravity to pull the fluid to the lowest place in your body. That makes the condition more common in the legs and thighs (lower extremities). Painless swelling of the feet and ankles is common and becomes more likely as you get older. It is also common in looser tissues, like around your eyes.  °When the affected area is squeezed, the fluid may move out of that spot and leave a dent for a few moments. This dent is called pitting.  °CAUSES  °There are many possible causes of edema. Eating too much salt and being on your feet or sitting for a long time can cause edema in your legs and ankles. Hot weather may make edema worse. Common medical causes of edema include: °· Heart failure. °· Liver disease. °· Kidney disease. °· Weak blood vessels in your legs. °· Cancer. °· An injury. °· Pregnancy. °· Some medications. °· Obesity.  °SYMPTOMS  °Edema is usually painless. Your skin may look swollen or shiny.  °DIAGNOSIS  °Your health care provider may be able to diagnose edema by asking about your medical history and doing a physical exam. You may need to have tests such as X-rays, an electrocardiogram, or blood tests to check for medical conditions that may cause edema.  °TREATMENT  °Edema treatment depends on the cause. If you have heart, liver, or kidney disease, you need the treatment appropriate for these conditions. General treatment may include: °· Elevation of the affected body part above the level of your heart. °· Compression of the affected body part. Pressure from elastic bandages or support stockings squeezes the tissues and forces fluid back into the blood vessels. This keeps fluid from entering the tissues. °· Restriction of fluid and salt intake. °· Use of a water pill (diuretic). These medications are appropriate only for some types of edema. They pull fluid out of your body and make you urinate more often. This  gets rid of fluid and reduces swelling, but diuretics can have side effects. Only use diuretics as directed by your health care provider. °HOME CARE INSTRUCTIONS  °· Keep the affected body part above the level of your heart when you are lying down.   °· Do not sit still or stand for prolonged periods.   °· Do not put anything directly under your knees when lying down. °· Do not wear constricting clothing or garters on your upper legs.   °· Exercise your legs to work the fluid back into your blood vessels. This may help the swelling go down.   °· Wear elastic bandages or support stockings to reduce ankle swelling as directed by your health care provider.   °· Eat a low-salt diet to reduce fluid if your health care provider recommends it.   °· Only take medicines as directed by your health care provider.  °SEEK MEDICAL CARE IF:  °· Your edema is not responding to treatment. °· You have heart, liver, or kidney disease and notice symptoms of edema. °· You have edema in your legs that does not improve after elevating them.   °· You have sudden and unexplained weight gain. °SEEK IMMEDIATE MEDICAL CARE IF:  °· You develop shortness of breath or chest pain.   °· You cannot breathe when you lie down. °· You develop pain, redness, or warmth in the swollen areas.   °· You have heart, liver, or kidney disease and suddenly get edema. °· You have a fever and your symptoms suddenly get worse. °MAKE SURE YOU:  °·   Understand these instructions. °· Will watch your condition. °· Will get help right away if you are not doing well or get worse. °  °This information is not intended to replace advice given to you by your health care provider. Make sure you discuss any questions you have with your health care provider. °  °Document Released: 12/22/2004 Document Revised: 01/12/2014 Document Reviewed: 10/14/2012 °Elsevier Interactive Patient Education ©2016 [employer] ° °

## 2015-01-02 LAB — VITAMIN B12: VITAMIN B 12: 689 pg/mL (ref 211–911)

## 2015-01-02 LAB — HEPATITIS C ANTIBODY: HCV AB: NEGATIVE

## 2015-01-02 LAB — TSH: TSH: 0.928 u[IU]/mL (ref 0.350–4.500)

## 2015-01-04 LAB — VITAMIN D 1,25 DIHYDROXY
VITAMIN D 1, 25 (OH) TOTAL: 57 pg/mL (ref 18–72)
VITAMIN D3 1, 25 (OH): 57 pg/mL
Vitamin D2 1, 25 (OH)2: <8 pg/mL

## 2015-06-03 ENCOUNTER — Other Ambulatory Visit: Payer: Self-pay | Admitting: Family Medicine

## 2015-06-11 ENCOUNTER — Other Ambulatory Visit: Payer: Self-pay | Admitting: Family Medicine

## 2015-06-11 NOTE — Telephone Encounter (Signed)
Medication filled to pharmacy as requested.   

## 2015-07-01 ENCOUNTER — Encounter: Payer: Self-pay | Admitting: Family Medicine

## 2015-07-01 ENCOUNTER — Other Ambulatory Visit: Payer: Self-pay | Admitting: Family Medicine

## 2015-07-01 ENCOUNTER — Ambulatory Visit (INDEPENDENT_AMBULATORY_CARE_PROVIDER_SITE_OTHER): Payer: BLUE CROSS/BLUE SHIELD | Admitting: Family Medicine

## 2015-07-01 VITALS — BP 126/82 | HR 60 | Temp 99.2°F | Ht 61.0 in | Wt 154.2 lb

## 2015-07-01 DIAGNOSIS — R5383 Other fatigue: Secondary | ICD-10-CM

## 2015-07-01 DIAGNOSIS — M8580 Other specified disorders of bone density and structure, unspecified site: Secondary | ICD-10-CM | POA: Diagnosis not present

## 2015-07-01 DIAGNOSIS — M150 Primary generalized (osteo)arthritis: Secondary | ICD-10-CM | POA: Diagnosis not present

## 2015-07-01 DIAGNOSIS — I10 Essential (primary) hypertension: Secondary | ICD-10-CM | POA: Diagnosis not present

## 2015-07-01 DIAGNOSIS — E785 Hyperlipidemia, unspecified: Secondary | ICD-10-CM

## 2015-07-01 DIAGNOSIS — M159 Polyosteoarthritis, unspecified: Secondary | ICD-10-CM

## 2015-07-01 LAB — THYROID PANEL WITH TSH
FREE THYROXINE INDEX: 2.8 (ref 1.4–3.8)
T3 Uptake: 29 % (ref 22–35)
T4, Total: 9.8 ug/dL (ref 4.5–12.0)
TSH: 1.23 m[IU]/L

## 2015-07-01 MED ORDER — AMLODIPINE BESY-BENAZEPRIL HCL 5-10 MG PO CAPS: 1.0000 | ORAL_CAPSULE | Freq: Every day | ORAL | Status: DC

## 2015-07-01 MED ORDER — PRAVASTATIN SODIUM 40 MG PO TABS: ORAL_TABLET | ORAL | Status: DC

## 2015-07-01 MED ORDER — CELECOXIB 200 MG PO CAPS: 200.0000 mg | ORAL_CAPSULE | Freq: Two times a day (BID) | ORAL | Status: DC

## 2015-07-01 MED ORDER — IBANDRONATE SODIUM 150 MG PO TABS: 150.0000 mg | ORAL_TABLET | ORAL | Status: DC

## 2015-07-01 NOTE — Patient Instructions (Signed)
Fatigue  Fatigue is feeling tired all of the time, a lack of energy, or a lack of motivation. Occasional or mild fatigue is often a normal response to activity or life in general. However, long-lasting (chronic) or extreme fatigue may indicate an underlying medical condition.  HOME CARE INSTRUCTIONS   Watch your fatigue for any changes. The following actions may help to lessen any discomfort you are feeling:  · Talk to your health care provider about how much sleep you need each night. Try to get the required amount every night.  · Take medicines only as directed by your health care provider.  · Eat a healthy and nutritious diet. Ask your health care provider if you need help changing your diet.  · Drink enough fluid to keep your urine clear or pale yellow.  · Practice ways of relaxing, such as yoga, meditation, massage therapy, or acupuncture.  · Exercise regularly.    · Change situations that cause you stress. Try to keep your work and personal routine reasonable.  · Do not abuse illegal drugs.  · Limit alcohol intake to no more than 1 drink per day for nonpregnant women and 2 drinks per day for men. One drink equals 12 ounces of beer, 5 ounces of wine, or 1½ ounces of hard liquor.  · Take a multivitamin, if directed by your health care provider.  SEEK MEDICAL CARE IF:   · Your fatigue does not get better.  · You have a fever.    · You have unintentional weight loss or gain.  · You have headaches.    · You have difficulty:      Falling asleep.    Sleeping throughout the night.  · You feel angry, guilty, anxious, or sad.     · You are unable to have a bowel movement (constipation).    · You skin is dry.     · Your legs or another part of your body is swollen.    SEEK IMMEDIATE MEDICAL CARE IF:   · You feel confused.    · Your vision is blurry.  · You feel faint or pass out.    · You have a severe headache.    · You have severe abdominal, pelvic, or back pain.    · You have chest pain, shortness of breath, or an  irregular or fast heartbeat.    · You are unable to urinate or you urinate less than normal.    · You develop abnormal bleeding, such as bleeding from the rectum, vagina, nose, lungs, or nipples.  · You vomit blood.     · You have thoughts about harming yourself or committing suicide.    · You are worried that you might harm someone else.       This information is not intended to replace advice given to you by your health care provider. Make sure you discuss any questions you have with your health care provider.     Document Released: 10/19/2006 Document Revised: 01/12/2014 Document Reviewed: 04/25/2013  Elsevier Interactive Patient Education ©2016 [employer]

## 2015-07-01 NOTE — Progress Notes (Signed)
Patient ID: Susan Torres, female    DOB: 1955/11/21  Age: 60 y.o. MRN: [id number]    Subjective:  Subjective HPI Susan Torres presents for f/u cholesterol and bp. She is also due for bmd and needs refills.  She c/o fatigue worsening over last 6 months.   No known cause.  Exercise helps but she is struggling to find time to exercise.    Review of Systems  Constitutional: Negative for diaphoresis, appetite change, fatigue and unexpected weight change.  Eyes: Negative for pain, redness and visual disturbance.  Respiratory: Negative for cough, chest tightness, shortness of breath and wheezing.   Cardiovascular: Negative for chest pain, palpitations and leg swelling.  Endocrine: Negative for cold intolerance, heat intolerance, polydipsia, polyphagia and polyuria.  Genitourinary: Negative for dysuria, frequency and difficulty urinating.  Neurological: Negative for dizziness, light-headedness, numbness and headaches.    History Past Medical History  Diagnosis Date  . Hypertension   . Hyperlipidemia   . Allergy   . Uterine fibroid     She has past surgical history that includes Eye surgery (2005); Knee surgery (2002); and Breast cyst excision.   Her family history includes Alcohol abuse in her father; Diabetes in her maternal aunt and mother; Hypertension in her mother; Stomach cancer in her father.She reports that she has been passively smoking.  She has never used smokeless tobacco. She reports that she drinks alcohol. She reports that she does not use illicit drugs.  Current Outpatient Prescriptions on File Prior to Visit  Medication Sig Dispense Refill  . cetirizine (ZYRTEC) 10 MG tablet Take 10 mg by mouth daily.      Marland Kitchen HYDROcodone-acetaminophen (NORCO/VICODIN) 5-325 MG per tablet Take 0.5 tablets by mouth every 6 (six) hours as needed for moderate pain. 30 tablet 0  . Multiple Vitamin (MULTIVITAMIN) tablet Take 1 tablet by mouth daily.    Marland Kitchen NASONEX 50 MCG/ACT nasal spray PLACE  2 SPRAYS INTO THE NOSE DAILY. 17 g 2  . PREMPRO 0.3-1.5 MG per tablet Take 1 tablet by mouth daily.      No current facility-administered medications on file prior to visit.     Objective:  Objective Physical Exam  Constitutional: She is oriented to person, place, and time. She appears well-developed and well-nourished.  HENT:  Head: Normocephalic and atraumatic.  Eyes: Conjunctivae and EOM are normal.  Neck: Normal range of motion. Neck supple. No JVD present. Carotid bruit is not present. No thyromegaly present.  Cardiovascular: Normal rate, regular rhythm and normal heart sounds.   No murmur heard. Pulmonary/Chest: Effort normal and breath sounds normal. No respiratory distress. She has no wheezes. She has no rales. She exhibits no tenderness.  Musculoskeletal: She exhibits no edema.  Neurological: She is alert and oriented to person, place, and time.  Psychiatric: She has a normal mood and affect. Her behavior is normal.  Nursing note and vitals reviewed.  BP 126/82 mmHg  Pulse 60  Temp(Src) 99.2 F (37.3 C) (Oral)  Ht 5\' 1"  ([id number] m)  Wt 154 lb 3.2 oz (69.945 kg)  BMI 29.15 kg/m2  SpO2 98% Wt Readings from Last 3 Encounters:  07/01/15 154 lb 3.2 oz (69.945 kg)  01/01/15 154 lb (69.854 kg)  05/14/14 154 lb 1.6 oz (69.899 kg)     Lab Results  Component Value Date   WBC 8.1 01/01/2015   HGB 14.3 01/01/2015   HCT 42.7 01/01/2015   PLT 345 01/01/2015   GLUCOSE 80 01/01/2015   CHOL 144 01/01/2015  TRIG 68 01/01/2015   HDL 68 01/01/2015   LDLCALC 62 01/01/2015   ALT 19 01/01/2015   AST 19 01/01/2015   NA 137 01/01/2015   K 4.1 01/01/2015   CL 103 01/01/2015   CREATININE 0.66 01/01/2015   BUN 13 01/01/2015   CO2 24 01/01/2015   TSH 0.928 01/01/2015   MICROALBUR <0.7 05/14/2014    Dg Bone Density  07/12/2012  The Bone Mineral Densitometry hard-copy report (which includes all data, graphical display, and FRAX results when applicable) has been sent directly to  the ordering physician. This report can also be obtained electronically by viewing images for this exam through the performing facility's EMR, or by logging directly into [employer]. Original Report Authenticated By: Ponciano Ort, M.D.     Assessment & Plan:  Plan I have changed Susan Torres's celecoxib. I am also having her maintain her PREMPRO, cetirizine, multivitamin, NASONEX, HYDROcodone-acetaminophen, amLODipine-benazepril, pravastatin, and ibandronate.  Meds ordered this encounter  Medications  . amLODipine-benazepril (LOTREL) 5-10 MG capsule    Sig: Take 1 capsule by mouth daily.    Dispense:  90 capsule    Refill:  3  . pravastatin (PRAVACHOL) 40 MG tablet    Sig: TAKE 1 TABLET (40 MG TOTAL) BY MOUTH DAILY.    Dispense:  90 tablet    Refill:  3  . ibandronate (BONIVA) 150 MG tablet    Sig: Take 1 tablet (150 mg total) by mouth every 30 (thirty) days.    Dispense:  3 tablet    Refill:  3  . celecoxib (CELEBREX) 200 MG capsule    Sig: Take 1 capsule (200 mg total) by mouth 2 (two) times daily.    Dispense:  180 capsule    Refill:  3    Problem List Items Addressed This Visit      Unprioritized   Osteopenia   Relevant Medications   ibandronate (BONIVA) 150 MG tablet   Other Relevant Orders   DG Bone Density   Vitamin D (25 hydroxy)    Other Visit Diagnoses    Hyperlipidemia    -  Primary    Relevant Medications    amLODipine-benazepril (LOTREL) 5-10 MG capsule    pravastatin (PRAVACHOL) 40 MG tablet    Other Relevant Orders    Comprehensive metabolic panel    Lipid panel    Essential hypertension        Relevant Medications    amLODipine-benazepril (LOTREL) 5-10 MG capsule    pravastatin (PRAVACHOL) 40 MG tablet    Primary osteoarthritis involving multiple joints        Relevant Medications    celecoxib (CELEBREX) 200 MG capsule    Other fatigue        Relevant Orders    Vitamin B12    CBC with Differential/Platelet    Thyroid Panel With TSH        Follow-up: Return if symptoms worsen or fail to improve.  Ann Held, DO

## 2015-07-01 NOTE — Progress Notes (Signed)
Pre visit review using our clinic review tool, if applicable. No additional management support is needed unless otherwise documented below in the visit note. 

## 2015-07-02 LAB — COMPREHENSIVE METABOLIC PANEL
ALT: 19 U/L (ref 6–29)
AST: 19 U/L (ref 10–35)
Albumin: 4.4 g/dL (ref 3.6–5.1)
Alkaline Phosphatase: 73 U/L (ref 33–130)
BUN: 8 mg/dL (ref 7–25)
CHLORIDE: 104 mmol/L (ref 98–110)
CO2: 26 mmol/L (ref 20–31)
Calcium: 9.2 mg/dL (ref 8.6–10.4)
Creat: 0.68 mg/dL (ref 0.50–1.05)
GLUCOSE: 88 mg/dL (ref 65–99)
POTASSIUM: 4 mmol/L (ref 3.5–5.3)
Sodium: 139 mmol/L (ref 135–146)
TOTAL PROTEIN: 7.2 g/dL (ref 6.1–8.1)
Total Bilirubin: 0.3 mg/dL (ref 0.2–1.2)

## 2015-07-02 LAB — CBC WITH DIFFERENTIAL/PLATELET
BASOS ABS: 0 {cells}/uL (ref 0–200)
Basophils Relative: 0 %
EOS ABS: 336 {cells}/uL (ref 15–500)
EOS PCT: 4 %
HCT: 44.8 % (ref 35.0–45.0)
HEMOGLOBIN: 14.8 g/dL (ref 11.7–15.5)
Lymphocytes Relative: 23 %
Lymphs Abs: 1932 cells/uL (ref 850–3900)
MCH: 28.5 pg (ref 27.0–33.0)
MCHC: 33 g/dL (ref 32.0–36.0)
MCV: 86.3 fL (ref 80.0–100.0)
MONOS PCT: 5 %
MPV: 10.2 fL (ref 7.5–12.5)
Monocytes Absolute: 420 cells/uL (ref 200–950)
NEUTROS ABS: 5712 {cells}/uL (ref 1500–7800)
NEUTROS PCT: 68 %
PLATELETS: 342 10*3/uL (ref 140–400)
RBC: 5.19 MIL/uL — ABNORMAL HIGH (ref 3.80–5.10)
RDW: 14.1 % (ref 11.0–15.0)
WBC: 8.4 10*3/uL (ref 3.8–10.8)

## 2015-07-02 LAB — LIPID PANEL
CHOL/HDL RATIO: 2.1 ratio (ref <=5.0)
CHOLESTEROL: 143 mg/dL (ref 125–200)
HDL: 69 mg/dL (ref >=46)
LDL Cholesterol: 58 mg/dL (ref <130)
Triglycerides: 79 mg/dL (ref <150)
VLDL: 16 mg/dL (ref <30)

## 2015-07-02 LAB — VITAMIN B12: VITAMIN B 12: 833 pg/mL (ref 200–1100)

## 2015-07-02 NOTE — Telephone Encounter (Signed)
I spoke with Susan Torres at [facility] and we were able to add the CMP, LIPID, VIT D, & VIT B since we sent a Thyroid Panel to them. I do not know if they will have enough serum left to run all of the test but they will let us know. I cancelled the CBCd and ordered it for [facility] as well...KMP

## 2015-07-02 NOTE — Addendum Note (Signed)
Addended by: Caffie Pinto on: 07/02/2015 08:54 AM   Modules accepted: Orders

## 2015-07-02 NOTE — Addendum Note (Signed)
Addended by: Caffie Pinto on: 07/02/2015 08:57 AM   Modules accepted: Orders

## 2015-07-03 ENCOUNTER — Encounter: Payer: Self-pay | Admitting: *Deleted

## 2015-07-03 LAB — VITAMIN D 25 HYDROXY (VIT D DEFICIENCY, FRACTURES): VIT D 25 HYDROXY: 70 ng/mL (ref 30–100)

## 2015-08-07 ENCOUNTER — Ambulatory Visit (INDEPENDENT_AMBULATORY_CARE_PROVIDER_SITE_OTHER)
Admission: RE | Admit: 2015-08-07 | Discharge: 2015-08-07 | Disposition: A | Discharge status: Home / Self Care | Payer: BLUE CROSS/BLUE SHIELD | Source: Ambulatory Visit | Attending: Family Medicine | Admitting: Family Medicine

## 2015-08-07 DIAGNOSIS — M8580 Other specified disorders of bone density and structure, unspecified site: Secondary | ICD-10-CM

## 2015-08-13 DIAGNOSIS — M8580 Other specified disorders of bone density and structure, unspecified site: Secondary | ICD-10-CM | POA: Diagnosis not present

## 2015-09-16 ENCOUNTER — Encounter: Payer: BLUE CROSS/BLUE SHIELD | Admitting: Family Medicine

## 2015-09-20 ENCOUNTER — Encounter: Payer: Self-pay | Admitting: Family Medicine

## 2015-11-18 ENCOUNTER — Ambulatory Visit (INDEPENDENT_AMBULATORY_CARE_PROVIDER_SITE_OTHER): Payer: BLUE CROSS/BLUE SHIELD

## 2015-11-18 DIAGNOSIS — Z23 Encounter for immunization: Secondary | ICD-10-CM | POA: Diagnosis not present

## 2016-02-03 ENCOUNTER — Encounter: Payer: Self-pay | Admitting: Family Medicine

## 2016-03-17 ENCOUNTER — Encounter: Payer: BLUE CROSS/BLUE SHIELD | Admitting: Family Medicine

## 2016-04-03 ENCOUNTER — Encounter: Payer: BLUE CROSS/BLUE SHIELD | Admitting: Family Medicine

## 2016-04-30 ENCOUNTER — Ambulatory Visit (INDEPENDENT_AMBULATORY_CARE_PROVIDER_SITE_OTHER): Payer: BLUE CROSS/BLUE SHIELD | Admitting: Family Medicine

## 2016-04-30 ENCOUNTER — Encounter: Payer: Self-pay | Admitting: Family Medicine

## 2016-04-30 VITALS — BP 138/82 | HR 71 | Temp 98.2°F | Resp 16 | Ht 61.0 in | Wt 153.4 lb

## 2016-04-30 DIAGNOSIS — Z23 Encounter for immunization: Secondary | ICD-10-CM | POA: Diagnosis not present

## 2016-04-30 DIAGNOSIS — E782 Mixed hyperlipidemia: Secondary | ICD-10-CM | POA: Diagnosis not present

## 2016-04-30 DIAGNOSIS — I10 Essential (primary) hypertension: Secondary | ICD-10-CM | POA: Diagnosis not present

## 2016-04-30 DIAGNOSIS — R5383 Other fatigue: Secondary | ICD-10-CM

## 2016-04-30 DIAGNOSIS — M159 Polyosteoarthritis, unspecified: Secondary | ICD-10-CM

## 2016-04-30 DIAGNOSIS — D229 Melanocytic nevi, unspecified: Secondary | ICD-10-CM | POA: Diagnosis not present

## 2016-04-30 DIAGNOSIS — M150 Primary generalized (osteo)arthritis: Secondary | ICD-10-CM | POA: Diagnosis not present

## 2016-04-30 DIAGNOSIS — Z0000 Encounter for general adult medical examination without abnormal findings: Secondary | ICD-10-CM

## 2016-04-30 DIAGNOSIS — E162 Hypoglycemia, unspecified: Secondary | ICD-10-CM

## 2016-04-30 LAB — COMPREHENSIVE METABOLIC PANEL
ALK PHOS: 63 U/L (ref 33–130)
ALT: 18 U/L (ref 6–29)
AST: 21 U/L (ref 10–35)
Albumin: 4.3 g/dL (ref 3.6–5.1)
BILIRUBIN TOTAL: 0.5 mg/dL (ref 0.2–1.2)
BUN: 10 mg/dL (ref 7–25)
CO2: 22 mmol/L (ref 20–31)
CREATININE: 0.77 mg/dL (ref 0.50–1.05)
Calcium: 9.3 mg/dL (ref 8.6–10.4)
Chloride: 105 mmol/L (ref 98–110)
GLUCOSE: 89 mg/dL (ref 65–99)
Potassium: 4 mmol/L (ref 3.5–5.3)
Sodium: 140 mmol/L (ref 135–146)
TOTAL PROTEIN: 7 g/dL (ref 6.1–8.1)

## 2016-04-30 LAB — CBC
HCT: 43.2 % (ref 35.0–45.0)
HEMOGLOBIN: 14.4 g/dL (ref 11.7–15.5)
MCH: 28.7 pg (ref 27.0–33.0)
MCHC: 33.3 g/dL (ref 32.0–36.0)
MCV: 86.2 fL (ref 80.0–100.0)
MPV: 9.9 fL (ref 7.5–12.5)
Platelets: 337 10*3/uL (ref 140–400)
RBC: 5.01 MIL/uL (ref 3.80–5.10)
RDW: 14.1 % (ref 11.0–15.0)
WBC: 7.8 10*3/uL (ref 3.8–10.8)

## 2016-04-30 LAB — LIPID PANEL
CHOLESTEROL: 139 mg/dL (ref <200)
HDL: 67 mg/dL (ref >50)
LDL Cholesterol: 58 mg/dL (ref <100)
Total CHOL/HDL Ratio: 2.1 Ratio (ref <5.0)
Triglycerides: 72 mg/dL (ref <150)
VLDL: 14 mg/dL (ref <30)

## 2016-04-30 LAB — TSH: TSH: 2.31 m[IU]/L

## 2016-04-30 MED ORDER — CELECOXIB 200 MG PO CAPS: 200.0000 mg | ORAL_CAPSULE | Freq: Two times a day (BID) | ORAL | 3 refills | Status: DC

## 2016-04-30 MED ORDER — PRAVASTATIN SODIUM 40 MG PO TABS: ORAL_TABLET | ORAL | 3 refills | Status: DC

## 2016-04-30 MED ORDER — AMLODIPINE BESY-BENAZEPRIL HCL 5-10 MG PO CAPS: 1.0000 | ORAL_CAPSULE | Freq: Every day | ORAL | 3 refills | Status: DC

## 2016-04-30 MED ORDER — CETIRIZINE HCL 10 MG PO TABS: 10.0000 mg | ORAL_TABLET | Freq: Every day | ORAL | 2 refills | Status: AC

## 2016-04-30 NOTE — Progress Notes (Signed)
Subjective:  I acted as a Education administrator for Dr. Royden Purl, LPN    Patient ID: Susan Torres, female    DOB: 1955/05/31, 61 y.o.   MRN: [id number]  Chief Complaint  Patient presents with  . Annual Exam    HPI  Patient is in today for annual physical. Pt report she took a fall down her mother's stairs about a week ago and bruised her left leg and ankle. Pt denied pain of the left leg. Pt c/o mole on upper left side of chest.  Patient Care Team: Ann Held, DO as PCP - General Maia Breslow, MD as Consulting Physician (Orthopedic Surgery) Delila Pereyra, MD as Consulting Physician (Gynecology)   Past Medical History:  Diagnosis Date  . Allergy   . Hyperlipidemia   . Hypertension   . Uterine fibroid     Past Surgical History:  Procedure Laterality Date  . BREAST CYST EXCISION     Rght Breast  . EYE SURGERY  2005   Right Eye  . KNEE SURGERY  2002   Bilateral    Family History  Problem Relation Age of Onset  . Stomach cancer Father   . Hypertension Mother   . Alcohol abuse Father   . Diabetes Mother   . Diabetes Maternal Aunt     Social History   Social History  . Marital status: Single    Spouse name: N/A  . Number of children: N/A  . Years of education: N/A   Occupational History  . [employer]    Social History Main Topics  . Smoking status: Passive Smoke Exposure - Never Smoker  . Smokeless tobacco: Never Used  . Alcohol use Yes  . Drug use: No  . Sexual activity: Not Currently    Partners: Male   Other Topics Concern  . Not on file   Social History Narrative  . No narrative on file    Outpatient Medications Prior to Visit  Medication Sig Dispense Refill  . HYDROcodone-acetaminophen (NORCO/VICODIN) 5-325 MG per tablet Take 0.5 tablets by mouth every 6 (six) hours as needed for moderate pain. 30 tablet 0  . ibandronate (BONIVA) 150 MG tablet Take 1 tablet (150 mg total) by mouth every 30 (thirty) days. 3 tablet 3  . Multiple Vitamin  (MULTIVITAMIN) tablet Take 1 tablet by mouth daily.    Marland Kitchen NASONEX 50 MCG/ACT nasal spray PLACE 2 SPRAYS INTO THE NOSE DAILY. 17 g 2  . PREMPRO 0.3-1.5 MG per tablet Take 1 tablet by mouth daily.     Marland Kitchen amLODipine-benazepril (LOTREL) 5-10 MG capsule Take 1 capsule by mouth daily. 90 capsule 3  . celecoxib (CELEBREX) 200 MG capsule Take 1 capsule (200 mg total) by mouth 2 (two) times daily. 180 capsule 3  . cetirizine (ZYRTEC) 10 MG tablet Take 10 mg by mouth daily.      . pravastatin (PRAVACHOL) 40 MG tablet TAKE 1 TABLET (40 MG TOTAL) BY MOUTH DAILY. 90 tablet 3   No facility-administered medications prior to visit.     No Known Allergies  Review of Systems  Constitutional: Negative for chills, fever and malaise/fatigue.  HENT: Negative for congestion and hearing loss.   Eyes: Negative for blurred vision and discharge.  Respiratory: Negative for cough, sputum production and shortness of breath.   Cardiovascular: Negative for chest pain, palpitations and leg swelling.  Gastrointestinal: Negative for abdominal pain, blood in stool, constipation, diarrhea, heartburn, nausea and vomiting.  Genitourinary: Negative for dysuria, frequency, hematuria and  urgency.  Musculoskeletal: Negative for back pain, falls and myalgias.  Skin: Negative for rash.  Neurological: Negative for dizziness, sensory change, loss of consciousness, weakness and headaches.  Endo/Heme/Allergies: Negative for environmental allergies. Does not bruise/bleed easily.  Psychiatric/Behavioral: Negative for depression and suicidal ideas. The patient is not nervous/anxious and does not have insomnia.        Objective:    Physical Exam  Constitutional: She is oriented to person, place, and time. She appears well-developed and well-nourished. No distress.  HENT:  Right Ear: External ear normal.  Left Ear: External ear normal.  Nose: Nose normal.  Mouth/Throat: Oropharynx is clear and moist.  Eyes: EOM are normal. Pupils are  equal, round, and reactive to light.  Neck: Normal range of motion. Neck supple.  Cardiovascular: Normal rate, regular rhythm and normal heart sounds.   No murmur heard. Pulmonary/Chest: Effort normal and breath sounds normal. No respiratory distress. She has no wheezes. She has no rales. She exhibits no tenderness.  Neurological: She is alert and oriented to person, place, and time.  Psychiatric: She has a normal mood and affect. Her behavior is normal. Judgment and thought content normal.  Nursing note and vitals reviewed.   BP 138/82 (BP Location: Left Arm, Patient Position: Sitting, Cuff Size: Normal)   Pulse 71   Temp 98.2 F (36.8 C) (Oral)   Resp 16   Ht _0  (1.549 m)   Wt 153 lb 6.4 oz (69.6 kg)   SpO2 98%   BMI 28.98 kg/m  Wt Readings from Last 3 Encounters:  04/30/16 153 lb 6.4 oz (69.6 kg)  07/01/15 154 lb 3.2 oz (69.9 kg)  01/01/15 154 lb (69.9 kg)   BP Readings from Last 3 Encounters:  04/30/16 138/82  07/01/15 126/82  01/01/15 (!) 122/58     Immunization History  Administered Date(s) Administered  . Influenza Split 10/13/2011  . Influenza Whole 10/27/2007, 11/23/2008, 10/07/2009  . Influenza,inj,Quad PF,36+ Mos 10/05/2012, 11/18/2015  . Influenza-Unspecified 10/15/2014  . Td 03/21/1997, 03/17/2007, 04/23/2008    Health Maintenance  Topic Date Due  . MAMMOGRAM  05/06/2016  . INFLUENZA VACCINE  08/05/2016  . PAP SMEAR  04/24/2017  . TETANUS/TDAP  04/24/2018  . COLONOSCOPY  06/21/2018  . Hepatitis C Screening  Completed  . HIV Screening  Completed    Lab Results  Component Value Date   WBC 7.8 04/30/2016   HGB 14.4 04/30/2016   HCT 43.2 04/30/2016   PLT 337 04/30/2016   GLUCOSE 88 07/01/2015   CHOL 143 07/01/2015   TRIG 79 07/01/2015   HDL 69 07/01/2015   LDLCALC 58 07/01/2015   ALT 19 07/01/2015   AST 19 07/01/2015   NA 139 07/01/2015   K 4.0 07/01/2015   CL 104 07/01/2015   CREATININE 0.68 07/01/2015   BUN 8 07/01/2015   CO2 26  07/01/2015   TSH 2.31 04/30/2016   MICROALBUR <0.7 05/14/2014    Lab Results  Component Value Date   TSH 2.31 04/30/2016   Lab Results  Component Value Date   WBC 7.8 04/30/2016   HGB 14.4 04/30/2016   HCT 43.2 04/30/2016   MCV 86.2 04/30/2016   PLT 337 04/30/2016   Lab Results  Component Value Date   NA 139 07/01/2015   K 4.0 07/01/2015   CO2 26 07/01/2015   GLUCOSE 88 07/01/2015   BUN 8 07/01/2015   CREATININE 0.68 07/01/2015   BILITOT 0.3 07/01/2015   ALKPHOS 73 07/01/2015   AST 19  07/01/2015   ALT 19 07/01/2015   PROT 7.2 07/01/2015   ALBUMIN 4.4 07/01/2015   CALCIUM 9.2 07/01/2015   GFR 109.08 05/14/2014   Lab Results  Component Value Date   CHOL 143 07/01/2015   Lab Results  Component Value Date   HDL 69 07/01/2015   Lab Results  Component Value Date   LDLCALC 58 07/01/2015   Lab Results  Component Value Date   TRIG 79 07/01/2015   Lab Results  Component Value Date   CHOLHDL 2.1 07/01/2015   No results found for: HGBA1C       Assessment & Plan:   Problem List Items Addressed This Visit      Unprioritized   Hypoglycemia   Relevant Orders   CBC (Completed)   HgB A1c (Completed)   Comp Met (CMET) (Completed)   Lipid panel (Completed)   TSH (Completed)   Urinalysis (Completed)   Essential hypertension - Primary    Well controlled, no changes to meds. Encouraged heart healthy diet such as the DASH diet and exercise as tolerated.       Relevant Medications   pravastatin (PRAVACHOL) 40 MG tablet   amLODipine-benazepril (LOTREL) 5-10 MG capsule   Other Relevant Orders   CBC (Completed)   HgB A1c (Completed)   Comp Met (CMET) (Completed)   Lipid panel (Completed)   TSH (Completed)   Urinalysis (Completed)   Preventative health care    ghm utd Check labs See AVS      Relevant Orders   CBC (Completed)   HgB A1c (Completed)   Comp Met (CMET) (Completed)   Lipid panel (Completed)   TSH (Completed)   Urinalysis (Completed)      Other Visit Diagnoses    Mixed hyperlipidemia       Relevant Medications   pravastatin (PRAVACHOL) 40 MG tablet   amLODipine-benazepril (LOTREL) 5-10 MG capsule   Other Relevant Orders   CBC (Completed)   HgB A1c (Completed)   Comp Met (CMET) (Completed)   Lipid panel (Completed)   TSH (Completed)   Urinalysis (Completed)   Primary osteoarthritis involving multiple joints       Relevant Medications   celecoxib (CELEBREX) 200 MG capsule   Other Relevant Orders   CBC (Completed)   HgB A1c (Completed)   Comp Met (CMET) (Completed)   Lipid panel (Completed)   TSH (Completed)   Urinalysis (Completed)   Change in mole       Relevant Orders   Ambulatory referral to Dermatology   CBC (Completed)   HgB A1c (Completed)   Comp Met (CMET) (Completed)   Lipid panel (Completed)   TSH (Completed)   Urinalysis (Completed)   Fatigue, unspecified type       Relevant Orders   CBC (Completed)   HgB A1c (Completed)   Comp Met (CMET) (Completed)   Lipid panel (Completed)   TSH (Completed)   Urinalysis (Completed)      I have changed Ms. Bickert's cetirizine. I am also having her maintain her PREMPRO, multivitamin, NASONEX, HYDROcodone-acetaminophen, ibandronate, pravastatin, celecoxib, and amLODipine-benazepril.  Meds ordered this encounter  Medications  . pravastatin (PRAVACHOL) 40 MG tablet    Sig: TAKE 1 TABLET (40 MG TOTAL) BY MOUTH DAILY.    Dispense:  90 tablet    Refill:  3  . cetirizine (ZYRTEC) 10 MG tablet    Sig: Take 1 tablet (10 mg total) by mouth daily.    Dispense:  90 tablet    Refill:  2  .  celecoxib (CELEBREX) 200 MG capsule    Sig: Take 1 capsule (200 mg total) by mouth 2 (two) times daily.    Dispense:  180 capsule    Refill:  3  . amLODipine-benazepril (LOTREL) 5-10 MG capsule    Sig: Take 1 capsule by mouth daily.    Dispense:  90 capsule    Refill:  3    CMA served as scribe during this visit. History, Physical and Plan performed by medical provider.  Documentation and orders reviewed and attested to.  Ann Held, DO   Patient ID: CHASSIE PENNIX, female   DOB: 21-Jun-1955, 61 y.o.   MRN: [id number]

## 2016-04-30 NOTE — Assessment & Plan Note (Signed)
ghm utd Check labs See AVS 

## 2016-04-30 NOTE — Progress Notes (Signed)
Pre visit review using our clinic review tool, if applicable. No additional management support is needed unless otherwise documented below in the visit note. 

## 2016-04-30 NOTE — Assessment & Plan Note (Signed)
Well controlled, no changes to meds. Encouraged heart healthy diet such as the DASH diet and exercise as tolerated.  °

## 2016-04-30 NOTE — Patient Instructions (Signed)

## 2016-04-30 NOTE — Assessment & Plan Note (Signed)
Tolerating statin, encouraged heart healthy diet, avoid trans fats, minimize simple carbs and saturated fats. Increase exercise as tolerated 

## 2016-05-01 LAB — URINALYSIS
BILIRUBIN URINE: NEGATIVE
Glucose, UA: NEGATIVE
Hgb urine dipstick: NEGATIVE
Ketones, ur: NEGATIVE
LEUKOCYTES UA: NEGATIVE
NITRITE: NEGATIVE
PROTEIN: NEGATIVE
Specific Gravity, Urine: 1.017 (ref 1.001–1.035)
pH: 6 (ref 5.0–8.0)

## 2016-05-01 LAB — HEMOGLOBIN A1C
Hgb A1c MFr Bld: 5.5 % (ref <5.7)
MEAN PLASMA GLUCOSE: 111 mg/dL

## 2016-05-01 NOTE — Addendum Note (Signed)
Addended by: Marjory Lies on: 05/01/2016 03:00 PM   Modules accepted: Orders

## 2016-05-06 ENCOUNTER — Other Ambulatory Visit: Payer: Self-pay | Admitting: *Deleted

## 2016-05-06 DIAGNOSIS — E162 Hypoglycemia, unspecified: Secondary | ICD-10-CM

## 2016-05-06 DIAGNOSIS — I10 Essential (primary) hypertension: Secondary | ICD-10-CM

## 2016-05-06 DIAGNOSIS — E785 Hyperlipidemia, unspecified: Secondary | ICD-10-CM

## 2016-06-04 ENCOUNTER — Other Ambulatory Visit: Payer: Self-pay | Admitting: Family Medicine

## 2016-06-04 DIAGNOSIS — M8580 Other specified disorders of bone density and structure, unspecified site: Secondary | ICD-10-CM

## 2016-06-11 ENCOUNTER — Encounter: Payer: Self-pay | Admitting: Family Medicine

## 2016-07-26 ENCOUNTER — Other Ambulatory Visit: Payer: Self-pay | Admitting: Family Medicine

## 2016-07-26 DIAGNOSIS — E785 Hyperlipidemia, unspecified: Secondary | ICD-10-CM

## 2016-07-27 ENCOUNTER — Other Ambulatory Visit: Payer: Self-pay | Admitting: Family Medicine

## 2016-07-27 DIAGNOSIS — M159 Polyosteoarthritis, unspecified: Secondary | ICD-10-CM

## 2016-07-27 DIAGNOSIS — M150 Primary generalized (osteo)arthritis: Principal | ICD-10-CM

## 2016-09-04 ENCOUNTER — Telehealth: Payer: Self-pay

## 2016-09-04 NOTE — Telephone Encounter (Signed)
Called patient to schedule 2nd shingrix vaccine.  No answer.  Left a message for call back.    Schedulers--when patient calls back, please schedule nurse visit for 2nd shingrix before October 3rd. Thanks.

## 2016-09-30 LAB — HM PAP SMEAR: HM PAP: NEGATIVE

## 2016-10-06 ENCOUNTER — Ambulatory Visit: Payer: BLUE CROSS/BLUE SHIELD

## 2016-10-16 ENCOUNTER — Ambulatory Visit (INDEPENDENT_AMBULATORY_CARE_PROVIDER_SITE_OTHER): Payer: BLUE CROSS/BLUE SHIELD

## 2016-10-16 DIAGNOSIS — Z23 Encounter for immunization: Secondary | ICD-10-CM | POA: Diagnosis not present

## 2017-01-10 ENCOUNTER — Encounter: Payer: Self-pay | Admitting: Family Medicine

## 2017-04-29 ENCOUNTER — Encounter: Payer: Self-pay | Admitting: Family Medicine

## 2017-05-24 ENCOUNTER — Other Ambulatory Visit: Payer: Self-pay

## 2017-05-24 ENCOUNTER — Emergency Department (HOSPITAL_BASED_OUTPATIENT_CLINIC_OR_DEPARTMENT_OTHER): Payer: BLUE CROSS/BLUE SHIELD

## 2017-05-24 ENCOUNTER — Emergency Department (HOSPITAL_BASED_OUTPATIENT_CLINIC_OR_DEPARTMENT_OTHER)
Admission: EM | Admit: 2017-05-24 | Discharge: 2017-05-24 | Disposition: A | Discharge status: Home / Self Care | Payer: BLUE CROSS/BLUE SHIELD | Attending: Emergency Medicine | Admitting: Emergency Medicine

## 2017-05-24 ENCOUNTER — Encounter (HOSPITAL_BASED_OUTPATIENT_CLINIC_OR_DEPARTMENT_OTHER): Payer: Self-pay | Admitting: *Deleted

## 2017-05-24 DIAGNOSIS — R0789 Other chest pain: Secondary | ICD-10-CM | POA: Diagnosis not present

## 2017-05-24 DIAGNOSIS — Z7722 Contact with and (suspected) exposure to environmental tobacco smoke (acute) (chronic): Secondary | ICD-10-CM | POA: Diagnosis not present

## 2017-05-24 DIAGNOSIS — R079 Chest pain, unspecified: Secondary | ICD-10-CM | POA: Diagnosis present

## 2017-05-24 DIAGNOSIS — I10 Essential (primary) hypertension: Secondary | ICD-10-CM | POA: Insufficient documentation

## 2017-05-24 DIAGNOSIS — Z79899 Other long term (current) drug therapy: Secondary | ICD-10-CM | POA: Insufficient documentation

## 2017-05-24 DIAGNOSIS — F419 Anxiety disorder, unspecified: Secondary | ICD-10-CM | POA: Insufficient documentation

## 2017-05-24 LAB — CBC
HEMATOCRIT: 43.4 % (ref 36.0–46.0)
HEMOGLOBIN: 15.3 g/dL — AB (ref 12.0–15.0)
MCH: 30 pg (ref 26.0–34.0)
MCHC: 35.3 g/dL (ref 30.0–36.0)
MCV: 85.1 fL (ref 78.0–100.0)
Platelets: 294 10*3/uL (ref 150–400)
RBC: 5.1 MIL/uL (ref 3.87–5.11)
RDW: 13.4 % (ref 11.5–15.5)
WBC: 8.5 10*3/uL (ref 4.0–10.5)

## 2017-05-24 LAB — BASIC METABOLIC PANEL
ANION GAP: 7 (ref 5–15)
BUN: 8 mg/dL (ref 6–20)
CHLORIDE: 110 mmol/L (ref 101–111)
CO2: 20 mmol/L — ABNORMAL LOW (ref 22–32)
Calcium: 8.8 mg/dL — ABNORMAL LOW (ref 8.9–10.3)
Creatinine, Ser: 0.58 mg/dL (ref 0.44–1.00)
GFR calc Af Amer: >60 mL/min (ref >60)
Glucose, Bld: 98 mg/dL (ref 65–99)
POTASSIUM: 3.4 mmol/L — AB (ref 3.5–5.1)
SODIUM: 137 mmol/L (ref 135–145)

## 2017-05-24 LAB — TROPONIN I: Troponin I: <0.03 ng/mL (ref <0.03)

## 2017-05-24 MED ORDER — HYDROXYZINE HCL 25 MG PO TABS
ORAL_TABLET | ORAL | Status: AC
  Filled 2017-05-24: qty 1

## 2017-05-24 MED ORDER — HYDROXYZINE HCL 25 MG PO TABS: 25.0000 mg | ORAL_TABLET | Freq: Three times a day (TID) | ORAL | 0 refills | Status: DC | PRN

## 2017-05-24 MED ORDER — HYDROXYZINE HCL 25 MG PO TABS
25.0000 mg | ORAL_TABLET | Freq: Once | ORAL | Status: AC
  Administered 2017-05-24: 25 mg via ORAL

## 2017-05-24 MED ORDER — HYDROXYZINE HCL 10 MG PO TABS
10.0000 mg | ORAL_TABLET | Freq: Once | ORAL | Status: DC
Start: 2017-05-24 — End: 2017-05-24
  Filled 2017-05-24: qty 1

## 2017-05-24 MED FILL — hydrOXYzine HCL 25 MG TABS: 25 | 4 days supply | Qty: 12 | Fill #0

## 2017-05-24 NOTE — ED Provider Notes (Signed)
[facility] EMERGENCY DEPARTMENT Provider Note   CSN: [id number] Arrival date & time: 05/24/17  1253     History   Chief Complaint Chief Complaint  Patient presents with  . Chest Pain    HPI Susan Torres is a 62 y.o. female.  HPI   62 year old female with history of hypertension, hyperlipidemia, myalgia presenting for evaluation of chest pain.  Patient report for the past 2 days she has been having pain in her chest in which she described as a pressure sensation on top of her chest with tingling sensation down her left arm.  Pain is waxing waning, mild currently.  There is no associated shortness of breath but she does come endorse some lightheadedness with it.  No diaphoresis no exertional chest pain.  She has had similar chest discomfort like this in the past related to anxiety and stress.  Patient admits that she is under a lot of stress lately with family members having failing health.  She is unsure if this is related to her stress.  She does have a family history of high blood pressure.  She is currently compliant with her medication.  She denies any prior history of PE DVT, no recent surgery, prolonged bed rest, recent travel or active cancer.  She is a former smoker.  She drinks alcohol only on occasion.  Past Medical History:  Diagnosis Date  . Allergy   . Hyperlipidemia   . Hypertension   . Uterine fibroid     Patient Active Problem List   Diagnosis Date Noted  . Preventative health care 04/30/2016  . Osteopenia 02/20/2013  . Hypoglycemia 11/06/2011  . HAND PAIN, BILATERAL 05/15/2009  . DIZZINESS 09/26/2008  . NAUSEA AND VOMITING 09/26/2008  . Hyperlipidemia 08/05/2007  . MYALGIA 08/05/2007  . Essential hypertension 03/17/2007  . ALLERGIC RHINITIS 03/17/2007    Past Surgical History:  Procedure Laterality Date  . BREAST CYST EXCISION     Rght Breast  . EYE SURGERY  2005   Right Eye  . KNEE SURGERY  2002   Bilateral     OB History   None      Home Medications    Prior to Admission medications   Medication Sig Start Date End Date Taking? Authorizing Provider  amLODipine-benazepril (LOTREL) 5-10 MG capsule Take 1 capsule by mouth daily. 04/30/16   Roma Schanz R, DO  celecoxib (CELEBREX) 200 MG capsule TAKE ONE CAPSULE BY MOUTH TWICE A DAY 07/27/16   Carollee Herter, Alferd Apa, DO  cetirizine (ZYRTEC) 10 MG tablet Take 1 tablet (10 mg total) by mouth daily. 04/30/16   Ann Held, DO  HYDROcodone-acetaminophen (NORCO/VICODIN) 5-325 MG per tablet Take 0.5 tablets by mouth every 6 (six) hours as needed for moderate pain. 08/17/13   Roma Schanz R, DO  ibandronate (BONIVA) 150 MG tablet TAKE 1 TABLET BY MOUTH EVERY 30 DAYS. 06/04/16   Ann Held, DO  Multiple Vitamin (MULTIVITAMIN) tablet Take 1 tablet by mouth daily.    [provider]  NASONEX 50 MCG/ACT nasal spray PLACE 2 SPRAYS INTO THE NOSE DAILY. 05/04/13   Roma Schanz R, DO  pravastatin (PRAVACHOL) 40 MG tablet TAKE 1 TABLET BY MOUTH EVERY DAY 07/27/16   Carollee Herter, Yvonne R, DO  PREMPRO 0.3-1.5 MG per tablet Take 1 tablet by mouth daily.  08/30/10   [provider]    Family History Family History  Problem Relation Age of Onset  . Stomach  cancer Father   . Alcohol abuse Father   . Hypertension Mother   . Diabetes Mother   . Diabetes Maternal Aunt     Social History Social History   Tobacco Use  . Smoking status: Passive Smoke Exposure - Never Smoker  . Smokeless tobacco: Never Used  Substance Use Topics  . Alcohol use: Yes  . Drug use: No     Allergies   Patient has no known allergies.   Review of Systems Review of Systems  All other systems reviewed and are negative.    Physical Exam Updated Vital Signs BP (!) 166/88   Pulse 66   Temp 98.4 F (36.9 C) (Oral)   Resp 20   Ht 5\' 1"  (1.549 m)   Wt 68 kg (150 lb)   SpO2 100%   BMI 28.34 kg/m   Physical Exam  Constitutional: She appears  well-developed and well-nourished. No distress.  HENT:  Head: Atraumatic.  Eyes: Conjunctivae are normal.  Neck: Neck supple.  Cardiovascular: Normal rate, regular rhythm, intact distal pulses and normal pulses.  Pulmonary/Chest: Effort normal and breath sounds normal. She has no decreased breath sounds. She has no wheezes. She has no rales.  Abdominal: Soft. There is no tenderness.  Musculoskeletal: Normal range of motion.       Right lower leg: She exhibits no edema.       Left lower leg: She exhibits no edema.  Neurological: She is alert.  Skin: No rash noted.  Psychiatric: She has a normal mood and affect.  Nursing note and vitals reviewed.    ED Treatments / Results  Labs (all labs ordered are listed, but only abnormal results are displayed) Labs Reviewed  BASIC METABOLIC PANEL - Abnormal; Notable for the following components:      Result Value   Potassium 3.4 (*)    CO2 20 (*)    Calcium 8.8 (*)    All other components within normal limits  CBC - Abnormal; Notable for the following components:   Hemoglobin 15.3 (*)    All other components within normal limits  TROPONIN I  TROPONIN I    EKG None   Date: 05/24/2017  Rate: 62  Rhythm: normal sinus rhythm  QRS Axis: normal  Intervals: normal  ST/T Wave abnormalities: normal  Conduction Disutrbances: none  Narrative Interpretation:   Old EKG Reviewed: No significant changes noted     Radiology Dg Chest 2 View  Result Date: 05/24/2017 CLINICAL DATA:  Chest pain for 2 weeks.  Nonsmoker. EXAM: CHEST - 2 VIEW COMPARISON:  None. FINDINGS: The heart size is at the upper limits of normal. The lungs are clear. There is no pleural effusion or pneumothorax. Telemetry leads overlie the chest. There is an exaggerated thoracolumbar lordosis with mild spondylosis. No acute osseous findings are seen. IMPRESSION: No active cardiopulmonary process. Electronically Signed   By: Richardean Sale M.D.   On: 05/24/2017 13:22     Procedures Procedures (including critical care time)  Medications Ordered in ED Medications  hydrOXYzine (ATARAX/VISTARIL) tablet 25 mg (25 mg Oral Given 05/24/17 1600)     Initial Impression / Assessment and Plan / ED Course  I have reviewed the triage vital signs and the nursing notes.  Pertinent labs & imaging results that were available during my care of the patient were reviewed by me and considered in my medical decision making (see chart for details).     BP (!) 157/101   Pulse 60   Temp 98.4  F (36.9 C) (Oral)   Resp 12   Ht 5\' 1"  (1.549 m)   Wt 68 kg (150 lb)   SpO2 100%   BMI 28.34 kg/m    Final Clinical Impressions(s) / ED Diagnoses   Final diagnoses:  Anxiety  Atypical chest pain    ED Discharge Orders        Ordered    hydrOXYzine (ATARAX/VISTARIL) 25 MG tablet  Every 8 hours PRN     05/24/17 1659     3:49 PM Patient here with chest pain brought on by stress.  This is atypical of ACS.  Initial EKG, troponin, and labs are reassuring.  Will obtain delta troponin.  4:55 PM Normal delta trop.  Patient resting comfortably and symptom has abated.  Blood pressure is 157/101.  She has not had her blood pressure medication today.  I encourage patient to follow-up closely with her PCP for further management of her condition.  She may benefit from an outpatient cardiac stress test if appropriate.  Patient discharged home with Vistaril as needed for anxiety.  Return precautions discussed.   Domenic Moras, PA-C 05/24/17 1700    Fredia Sorrow, MD 05/31/17 [phone]

## 2017-05-24 NOTE — ED Notes (Signed)
ED Provider at bedside. 

## 2017-05-24 NOTE — ED Triage Notes (Signed)
Chest pain when she woke this am. Pain started after hearing about a death. States she is under a lot of stress. States I don't know if this is anxiety.

## 2017-05-29 ENCOUNTER — Other Ambulatory Visit: Payer: Self-pay | Admitting: Family Medicine

## 2017-05-29 DIAGNOSIS — M8580 Other specified disorders of bone density and structure, unspecified site: Secondary | ICD-10-CM

## 2017-06-04 ENCOUNTER — Ambulatory Visit: Payer: BLUE CROSS/BLUE SHIELD | Admitting: Family Medicine

## 2017-06-04 ENCOUNTER — Encounter: Payer: Self-pay | Admitting: Family Medicine

## 2017-06-04 VITALS — BP 140/82 | HR 78 | Resp 16 | Ht 61.0 in | Wt 151.6 lb

## 2017-06-04 DIAGNOSIS — F419 Anxiety disorder, unspecified: Secondary | ICD-10-CM | POA: Diagnosis not present

## 2017-06-04 MED ORDER — ALPRAZOLAM 0.25 MG PO TABS: 0.2500 mg | ORAL_TABLET | Freq: Two times a day (BID) | ORAL | 0 refills | Status: DC | PRN

## 2017-06-04 MED ORDER — ESCITALOPRAM OXALATE 10 MG PO TABS: 10.0000 mg | ORAL_TABLET | Freq: Every day | ORAL | 2 refills | Status: DC

## 2017-06-04 NOTE — Progress Notes (Signed)
Patient ID: Susan Torres, female    DOB: Jan 11, 1955  Age: 62 y.o. MRN: [id number]    Subjective:  Subjective  HPI Susan Torres presents for ed f/u for chest pain,  Cardiac w/u was neg and pt was dx with anxiety.  She was given hydroxizine   Pt has been under a lot of stress -- her 1 brother has kidney failure and is on dialysis--- her other brother is in hospice for prostrate ca with bone metatisis---- her mother is taking care of both of them.     Review of Systems  Constitutional: Negative for appetite change, diaphoresis, fatigue and unexpected weight change.  Eyes: Negative for pain, redness and visual disturbance.  Respiratory: Negative for cough, chest tightness, shortness of breath and wheezing.   Cardiovascular: Negative for chest pain, palpitations and leg swelling.  Endocrine: Negative for cold intolerance, heat intolerance, polydipsia, polyphagia and polyuria.  Genitourinary: Negative for difficulty urinating, dysuria and frequency.  Neurological: Negative for dizziness, light-headedness, numbness and headaches.  Psychiatric/Behavioral: Negative for confusion, decreased concentration, dysphoric mood, hallucinations, self-injury, sleep disturbance and suicidal ideas. The patient is nervous/anxious. The patient is not hyperactive.     History Past Medical History:  Diagnosis Date  . Allergy   . Hyperlipidemia   . Hypertension   . Uterine fibroid     She has a past surgical history that includes Eye surgery (2005); Knee surgery (2002); and Breast cyst excision.   Her family history includes Alcohol abuse in her father; Alcoholism in her brother; Diabetes in her maternal aunt and mother; Hypertension in her mother; Kidney failure in her brother; Prostate cancer in her brother; Stomach cancer in her father.She reports that she is a non-smoker but has been exposed to tobacco smoke. She has never used smokeless tobacco. She reports that she drinks alcohol. She reports that she  does not use drugs.  Current Outpatient Medications on File Prior to Visit  Medication Sig Dispense Refill  . amLODipine-benazepril (LOTREL) 5-10 MG capsule Take 1 capsule by mouth daily. 90 capsule 3  . aspirin EC 81 MG tablet Take 81 mg by mouth daily.    . celecoxib (CELEBREX) 200 MG capsule TAKE ONE CAPSULE BY MOUTH TWICE A DAY 180 capsule 2  . cetirizine (ZYRTEC) 10 MG tablet Take 1 tablet (10 mg total) by mouth daily. 90 tablet 2  . HYDROcodone-acetaminophen (NORCO/VICODIN) 5-325 MG per tablet Take 0.5 tablets by mouth every 6 (six) hours as needed for moderate pain. 30 tablet 0  . hydrOXYzine (ATARAX/VISTARIL) 25 MG tablet Take 1 tablet (25 mg total) by mouth every 8 (eight) hours as needed for anxiety or nausea. 12 tablet 0  . ibandronate (BONIVA) 150 MG tablet TAKE 1 TABLET BY MOUTH EVERY 30 DAYS. 3 tablet 0  . Multiple Vitamin (MULTIVITAMIN) tablet Take 1 tablet by mouth daily.    Marland Kitchen NASONEX 50 MCG/ACT nasal spray PLACE 2 SPRAYS INTO THE NOSE DAILY. 17 g 2  . pravastatin (PRAVACHOL) 40 MG tablet TAKE 1 TABLET BY MOUTH EVERY DAY 90 tablet 3  . PREMPRO 0.3-1.5 MG per tablet Take 1 tablet by mouth daily.      No current facility-administered medications on file prior to visit.      Objective:  Objective  Physical Exam  Constitutional: She is oriented to person, place, and time. She appears well-developed and well-nourished.  HENT:  Head: Normocephalic and atraumatic.  Eyes: Conjunctivae and EOM are normal.  Neck: Normal range of motion. Neck supple. No  JVD present. Carotid bruit is not present. No thyromegaly present.  Cardiovascular: Normal rate, regular rhythm and normal heart sounds.  No murmur heard. Pulmonary/Chest: Effort normal and breath sounds normal. No respiratory distress. She has no wheezes. She has no rales. She exhibits no tenderness.  Musculoskeletal: She exhibits no edema.  Neurological: She is alert and oriented to person, place, and time.  Psychiatric: Her  speech is normal and behavior is normal. Judgment and thought content normal. Her mood appears anxious. Her affect is not angry. She does not exhibit a depressed mood.  Nursing note and vitals reviewed.  BP 140/82 (BP Location: Right Arm, Patient Position: Sitting, Cuff Size: Large)   Pulse 78   Resp 16   Ht 5\' 1"  (1.549 m)   Wt 151 lb 9.6 oz (68.8 kg)   SpO2 98%   BMI 28.64 kg/m  Wt Readings from Last 3 Encounters:  06/04/17 151 lb 9.6 oz (68.8 kg)  05/24/17 150 lb (68 kg)  04/30/16 153 lb 6.4 oz (69.6 kg)     Lab Results  Component Value Date   WBC 8.5 05/24/2017   HGB 15.3 (H) 05/24/2017   HCT 43.4 05/24/2017   PLT 294 05/24/2017   GLUCOSE 98 05/24/2017   CHOL 139 04/30/2016   TRIG 72 04/30/2016   HDL 67 04/30/2016   LDLCALC 58 04/30/2016   ALT 18 04/30/2016   AST 21 04/30/2016   NA 137 05/24/2017   K 3.4 (L) 05/24/2017   CL 110 05/24/2017   CREATININE 0.58 05/24/2017   BUN 8 05/24/2017   CO2 20 (L) 05/24/2017   TSH 2.31 04/30/2016   HGBA1C 5.5 04/30/2016   MICROALBUR <0.7 05/14/2014    Dg Chest 2 View  Result Date: 05/24/2017 CLINICAL DATA:  Chest pain for 2 weeks.  Nonsmoker. EXAM: CHEST - 2 VIEW COMPARISON:  None. FINDINGS: The heart size is at the upper limits of normal. The lungs are clear. There is no pleural effusion or pneumothorax. Telemetry leads overlie the chest. There is an exaggerated thoracolumbar lordosis with mild spondylosis. No acute osseous findings are seen. IMPRESSION: No active cardiopulmonary process. Electronically Signed   By: Richardean Sale M.D.   On: 05/24/2017 13:22     Assessment & Plan:  Plan  I am having Susan Torres start on escitalopram and ALPRAZolam. I am also having her maintain her PREMPRO, multivitamin, NASONEX, HYDROcodone-acetaminophen, cetirizine, amLODipine-benazepril, pravastatin, celecoxib, hydrOXYzine, ibandronate, and aspirin EC.  Meds ordered this encounter  Medications  . escitalopram (LEXAPRO) 10 MG tablet      Sig: Take 1 tablet (10 mg total) by mouth daily.    Dispense:  30 tablet    Refill:  2  . ALPRAZolam (XANAX) 0.25 MG tablet    Sig: Take 1 tablet (0.25 mg total) by mouth 2 (two) times daily as needed for anxiety.    Dispense:  20 tablet    Refill:  0    Problem List Items Addressed This Visit    None    Visit Diagnoses    Anxiety    -  Primary   Relevant Medications   escitalopram (LEXAPRO) 10 MG tablet   ALPRAZolam (XANAX) 0.25 MG tablet      Follow-up: Return if symptoms worsen or fail to improve.  Ann Held, DO

## 2017-06-04 NOTE — Patient Instructions (Signed)

## 2017-06-17 ENCOUNTER — Other Ambulatory Visit: Payer: Self-pay | Admitting: Family Medicine

## 2017-06-17 DIAGNOSIS — I10 Essential (primary) hypertension: Secondary | ICD-10-CM

## 2017-06-27 ENCOUNTER — Other Ambulatory Visit: Payer: Self-pay | Admitting: Family Medicine

## 2017-06-27 DIAGNOSIS — F419 Anxiety disorder, unspecified: Secondary | ICD-10-CM

## 2017-07-30 ENCOUNTER — Ambulatory Visit (INDEPENDENT_AMBULATORY_CARE_PROVIDER_SITE_OTHER): Payer: BLUE CROSS/BLUE SHIELD | Admitting: Family Medicine

## 2017-07-30 ENCOUNTER — Encounter: Payer: Self-pay | Admitting: Family Medicine

## 2017-07-30 VITALS — BP 149/77 | HR 69 | Temp 98.5°F | Ht 60.0 in | Wt 150.8 lb

## 2017-07-30 DIAGNOSIS — E785 Hyperlipidemia, unspecified: Secondary | ICD-10-CM

## 2017-07-30 DIAGNOSIS — F419 Anxiety disorder, unspecified: Secondary | ICD-10-CM | POA: Diagnosis not present

## 2017-07-30 DIAGNOSIS — I10 Essential (primary) hypertension: Secondary | ICD-10-CM | POA: Diagnosis not present

## 2017-07-30 DIAGNOSIS — R739 Hyperglycemia, unspecified: Secondary | ICD-10-CM

## 2017-07-30 MED ORDER — ALPRAZOLAM 0.25 MG PO TABS: 0.2500 mg | ORAL_TABLET | Freq: Two times a day (BID) | ORAL | 0 refills | Status: DC | PRN

## 2017-07-30 NOTE — Progress Notes (Signed)
Subjective:     Susan Torres is a 62 y.o. female and is here for a comprehensive physical exam. The patient reports no problems.  Social History   Socioeconomic History  . Marital status: Single    Spouse name: Not on file  . Number of children: Not on file  . Years of education: Not on file  . Highest education level: Not on file  Occupational History  . Occupation: [employer]  Social Needs  . Financial resource strain: Not on file  . Food insecurity:    Worry: Not on file    Inability: Not on file  . Transportation needs:    Medical: Not on file    Non-medical: Not on file  Tobacco Use  . Smoking status: Passive Smoke Exposure - Never Smoker  . Smokeless tobacco: Never Used  Substance and Sexual Activity  . Alcohol use: Yes  . Drug use: No  . Sexual activity: Not Currently    Partners: Male  Lifestyle  . Physical activity:    Days per week: Not on file    Minutes per session: Not on file  . Stress: Not on file  Relationships  . Social connections:    Talks on phone: Not on file    Gets together: Not on file    Attends religious service: Not on file    Active member of club or organization: Not on file    Attends meetings of clubs or organizations: Not on file    Relationship status: Not on file  . Intimate partner violence:    Fear of current or ex partner: Not on file    Emotionally abused: Not on file    Physically abused: Not on file    Forced sexual activity: Not on file  Other Topics Concern  . Not on file  Social History Narrative  . Not on file   Health Maintenance  Topic Date Due  . MAMMOGRAM  05/06/2016  . PAP SMEAR  04/24/2017  . INFLUENZA VACCINE  08/05/2017  . TETANUS/TDAP  04/24/2018  . COLONOSCOPY  06/21/2018  . Hepatitis C Screening  Completed  . HIV Screening  Completed    The following portions of the patient's history were reviewed and updated as appropriate:  She  has a past medical history of Allergy, Hyperlipidemia,  Hypertension, and Uterine fibroid. She does not have any pertinent problems on file. She  has a past surgical history that includes Eye surgery (2005); Knee surgery (2002); and Breast cyst excision. Her family history includes Alcohol abuse in her father; Alcoholism in her brother; Diabetes in her maternal aunt and mother; Hypertension in her mother; Kidney failure in her brother; Prostate cancer in her brother; Stomach cancer in her father. She  reports that she is a non-smoker but has been exposed to tobacco smoke. She has never used smokeless tobacco. She reports that she drinks alcohol. She reports that she does not use drugs. She has a current medication list which includes the following prescription(s): alprazolam, amlodipine-benazepril, aspirin ec, celecoxib, cetirizine, fluticasone, hydrocodone-acetaminophen, hydroxyzine, ibandronate, multivitamin, pravastatin, and prempro. Current Outpatient Medications on File Prior to Visit  Medication Sig Dispense Refill  . amLODipine-benazepril (LOTREL) 5-10 MG capsule TAKE 1 CAPSULE BY MOUTH DAILY. 90 capsule 1  . aspirin EC 81 MG tablet Take 81 mg by mouth daily.    . celecoxib (CELEBREX) 200 MG capsule TAKE ONE CAPSULE BY MOUTH TWICE A DAY 180 capsule 2  . cetirizine (ZYRTEC) 10 MG tablet Take  1 tablet (10 mg total) by mouth daily. 90 tablet 2  . fluticasone (FLONASE) 50 MCG/ACT nasal spray Place 1 spray into both nostrils as needed for allergies or rhinitis.    Marland Kitchen HYDROcodone-acetaminophen (NORCO/VICODIN) 5-325 MG per tablet Take 0.5 tablets by mouth every 6 (six) hours as needed for moderate pain. 30 tablet 0  . hydrOXYzine (ATARAX/VISTARIL) 25 MG tablet Take 1 tablet (25 mg total) by mouth every 8 (eight) hours as needed for anxiety or nausea. 12 tablet 0  . ibandronate (BONIVA) 150 MG tablet TAKE 1 TABLET BY MOUTH EVERY 30 DAYS. 3 tablet 0  . Multiple Vitamin (MULTIVITAMIN) tablet Take 1 tablet by mouth daily.    . pravastatin (PRAVACHOL) 40 MG  tablet TAKE 1 TABLET BY MOUTH EVERY DAY 90 tablet 3  . PREMPRO 0.3-1.5 MG per tablet Take 1 tablet by mouth daily.      No current facility-administered medications on file prior to visit.    She has No Known Allergies..  Review of Systems Review of Systems  Constitutional: Negative for activity change, appetite change and fatigue.  HENT: Negative for hearing loss, congestion, tinnitus and ear discharge.  dentist q69m Eyes: Negative for visual disturbance (see optho q1y -- vision corrected to 20/20 with glasses).  Respiratory: Negative for cough, chest tightness and shortness of breath.   Cardiovascular: Negative for chest pain, palpitations and leg swelling.  Gastrointestinal: Negative for abdominal pain, diarrhea, constipation and abdominal distention.  Genitourinary: Negative for urgency, frequency, decreased urine volume and difficulty urinating.  Musculoskeletal: Negative for back pain, arthralgias and gait problem.  Skin: Negative for color change, pallor and rash.  Neurological: Negative for dizziness, light-headedness, numbness and headaches.  Hematological: Negative for adenopathy. Does not bruise/bleed easily.  Psychiatric/Behavioral: Negative for suicidal ideas, confusion, sleep disturbance, self-injury, dysphoric mood, decreased concentration and agitation.       Objective:    BP (!) 149/77 (BP Location: Left Arm, Patient Position: Sitting, Cuff Size: Normal)   Pulse 69   Temp 98.5 F (36.9 C) (Oral)   Ht 5' (1.524 m)   Wt 150 lb 12.8 oz (68.4 kg)   SpO2 100%   BMI 29.45 kg/m  General appearance: alert, cooperative, appears stated age and no distress Head: Normocephalic, without obvious abnormality, atraumatic Eyes: conjunctivae/corneas clear. PERRL, EOM's intact. Fundi benign. Ears: normal TM's and external ear canals both ears Nose: Nares normal. Septum midline. Mucosa normal. No drainage or sinus tenderness. Throat: lips, mucosa, and tongue normal; teeth and  gums normal Neck: no adenopathy, no carotid bruit, no JVD, supple, symmetrical, trachea midline and thyroid not enlarged, symmetric, no tenderness/mass/nodules Back: symmetric, no curvature. ROM normal. No CVA tenderness. Lungs: clear to auscultation bilaterally Breasts:  Heart: regular rate and rhythm, S1, S2 normal, no murmur, click, rub or gallop Abdomen: soft, non-tender; bowel sounds normal; no masses,  no organomegaly Pelvic: deferred--gyn Extremities: extremities normal, atraumatic, no cyanosis or edema Pulses: 2+ and symmetric Skin: Skin color, texture, turgor normal. No rashes or lesions Lymph nodes: Cervical, supraclavicular, and axillary nodes normal. Neurologic: Alert and oriented X 3, normal strength and tone. Normal symmetric reflexes. Normal coordination and gait    Assessment:    Healthy female exam.      Plan:    ghm utd Check labs  See After Visit Summary for Counseling Recommendations    1. Anxiety  - ALPRAZolam (XANAX) 0.25 MG tablet; Take 1 tablet (0.25 mg total) by mouth 2 (two) times daily as needed for anxiety.  Dispense: 20 tablet; Refill: 0  2. Hyperlipidemia, unspecified hyperlipidemia type Tolerating statin, encouraged heart healthy diet, avoid trans fats, minimize simple carbs and saturated fats. Increase exercise as tolerated - TSH - Lipid panel - CBC with Differential/Platelet  - Comprehensive metabolic panel - Hemoglobin A1c  3. Hyperglycemia Check labs  - TSH - Lipid panel - CBC with Differential/Platelet - Comprehensive metabolic panel - Hemoglobin A1c  4. Essential hypertension Well controlled, no changes to meds. Encouraged heart healthy diet such as the DASH diet and exercise as tolerated.  - TSH - Lipid panel - CBC with Differential/Platelet - Comprehensive metabolic panel - Hemoglobin A1c

## 2017-07-30 NOTE — Patient Instructions (Signed)
Preventive Care 40-64 Years, Female Preventive care refers to lifestyle choices and visits with your health care provider that can promote health and wellness. What does preventive care include?  A yearly physical exam. This is also called an annual well check.  Dental exams once or twice a year.  Routine eye exams. Ask your health care provider how often you should have your eyes checked.  Personal lifestyle choices, including: ? Daily care of your teeth and gums. ? Regular physical activity. ? Eating a healthy diet. ? Avoiding tobacco and drug use. ? Limiting alcohol use. ? Practicing safe sex. ? Taking low-dose aspirin daily starting at age 58. ? Taking vitamin and mineral supplements as recommended by your health care provider. What happens during an annual well check? The services and screenings done by your health care provider during your annual well check will depend on your age, overall health, lifestyle risk factors, and family history of disease. Counseling Your health care provider may ask you questions about your:  Alcohol use.  Tobacco use.  Drug use.  Emotional well-being.  Home and relationship well-being.  Sexual activity.  Eating habits.  Work and work Statistician.  Method of birth control.  Menstrual cycle.  Pregnancy history.  Screening You may have the following tests or measurements:  Height, weight, and BMI.  Blood pressure.  Lipid and cholesterol levels. These may be checked every 5 years, or more frequently if you are over 81 years old.  Skin check.  Lung cancer screening. You may have this screening every year starting at age 78 if you have a 30-pack-year history of smoking and currently smoke or have quit within the past 15 years.  Fecal occult blood test (FOBT) of the stool. You may have this test every year starting at age 65.  Flexible sigmoidoscopy or colonoscopy. You may have a sigmoidoscopy every 5 years or a colonoscopy  every 10 years starting at age 30.  Hepatitis C blood test.  Hepatitis B blood test.  Sexually transmitted disease (STD) testing.  Diabetes screening. This is done by checking your blood sugar (glucose) after you have not eaten for a while (fasting). You may have this done every 1-3 years.  Mammogram. This may be done every 1-2 years. Talk to your health care provider about when you should start having regular mammograms. This may depend on whether you have a family history of breast cancer.  BRCA-related cancer screening. This may be done if you have a family history of breast, ovarian, tubal, or peritoneal cancers.  Pelvic exam and Pap test. This may be done every 3 years starting at age 80. Starting at age 36, this may be done every 5 years if you have a Pap test in combination with an HPV test.  Bone density scan. This is done to screen for osteoporosis. You may have this scan if you are at high risk for osteoporosis.  Discuss your test results, treatment options, and if necessary, the need for more tests with your health care provider. Vaccines Your health care provider may recommend certain vaccines, such as:  Influenza vaccine. This is recommended every year.  Tetanus, diphtheria, and acellular pertussis (Tdap, Td) vaccine. You may need a Td booster every 10 years.  Varicella vaccine. You may need this if you have not been vaccinated.  Zoster vaccine. You may need this after age 5.  Measles, mumps, and rubella (MMR) vaccine. You may need at least one dose of MMR if you were born in  1955 or later. You may also need a second dose.  Pneumococcal 13-valent conjugate (PCV13) vaccine. You may need this if you have certain conditions and were not previously vaccinated.  Pneumococcal polysaccharide (PPSV23) vaccine. You may need one or two doses if you smoke cigarettes or if you have certain conditions.  Meningococcal vaccine. You may need this if you have certain  conditions.  Hepatitis A vaccine. You may need this if you have certain conditions or if you travel or work in places where you may be exposed to hepatitis A.  Hepatitis B vaccine. You may need this if you have certain conditions or if you travel or work in places where you may be exposed to hepatitis B.  Haemophilus influenzae type b (Hib) vaccine. You may need this if you have certain conditions.  Talk to your health care provider about which screenings and vaccines you need and how often you need them. This information is not intended to replace advice given to you by your health care provider. Make sure you discuss any questions you have with your health care provider. Document Released: 01/18/2015 Document Revised: 09/11/2015 Document Reviewed: 10/23/2014 Elsevier Interactive Patient Education  2018 [employer]  

## 2017-07-31 LAB — CBC WITH DIFFERENTIAL/PLATELET
BASOS PCT: 0.7 %
Basophils Absolute: 53 cells/uL (ref 0–200)
Eosinophils Absolute: 180 cells/uL (ref 15–500)
Eosinophils Relative: 2.4 %
HEMATOCRIT: 42.7 % (ref 35.0–45.0)
HEMOGLOBIN: 14.7 g/dL (ref 11.7–15.5)
LYMPHS ABS: 1545 {cells}/uL (ref 850–3900)
MCH: 29.8 pg (ref 27.0–33.0)
MCHC: 34.4 g/dL (ref 32.0–36.0)
MCV: 86.4 fL (ref 80.0–100.0)
MPV: 9.9 fL (ref 7.5–12.5)
Monocytes Relative: 6.3 %
NEUTROS ABS: 5250 {cells}/uL (ref 1500–7800)
Neutrophils Relative %: 70 %
PLATELETS: 303 10*3/uL (ref 140–400)
RBC: 4.94 10*6/uL (ref 3.80–5.10)
RDW: 13 % (ref 11.0–15.0)
Total Lymphocyte: 20.6 %
WBC: 7.5 10*3/uL (ref 3.8–10.8)
WBCMIX: 473 {cells}/uL (ref 200–950)

## 2017-07-31 LAB — COMPREHENSIVE METABOLIC PANEL
AG Ratio: 1.7 (calc) (ref 1.0–2.5)
ALBUMIN MSPROF: 4.5 g/dL (ref 3.6–5.1)
ALKALINE PHOSPHATASE (APISO): 66 U/L (ref 33–130)
ALT: 17 U/L (ref 6–29)
AST: 21 U/L (ref 10–35)
BILIRUBIN TOTAL: 0.5 mg/dL (ref 0.2–1.2)
BUN: 13 mg/dL (ref 7–25)
CALCIUM: 9.6 mg/dL (ref 8.6–10.4)
CHLORIDE: 103 mmol/L (ref 98–110)
CO2: 26 mmol/L (ref 20–32)
CREATININE: 0.72 mg/dL (ref 0.50–0.99)
GLOBULIN: 2.7 g/dL (ref 1.9–3.7)
Glucose, Bld: 71 mg/dL (ref 65–99)
POTASSIUM: 4 mmol/L (ref 3.5–5.3)
SODIUM: 140 mmol/L (ref 135–146)
TOTAL PROTEIN: 7.2 g/dL (ref 6.1–8.1)

## 2017-07-31 LAB — LIPID PANEL
CHOLESTEROL: 142 mg/dL (ref <200)
HDL: 64 mg/dL (ref >50)
LDL Cholesterol (Calc): 63 mg/dL (calc)
Non-HDL Cholesterol (Calc): 78 mg/dL (calc) (ref <130)
Total CHOL/HDL Ratio: 2.2 (calc) (ref <5.0)
Triglycerides: 66 mg/dL (ref <150)

## 2017-07-31 LAB — TSH: TSH: 1.9 mIU/L (ref 0.40–4.50)

## 2017-07-31 LAB — HEMOGLOBIN A1C
HEMOGLOBIN A1C: 5.8 %{Hb} — AB (ref <5.7)
Mean Plasma Glucose: 120 (calc)
eAG (mmol/L): 6.6 (calc)

## 2017-08-30 ENCOUNTER — Other Ambulatory Visit: Payer: Self-pay | Admitting: Family Medicine

## 2017-08-30 DIAGNOSIS — M8580 Other specified disorders of bone density and structure, unspecified site: Secondary | ICD-10-CM

## 2017-09-12 ENCOUNTER — Other Ambulatory Visit: Payer: Self-pay | Admitting: Family Medicine

## 2017-09-12 DIAGNOSIS — E785 Hyperlipidemia, unspecified: Secondary | ICD-10-CM

## 2017-11-01 ENCOUNTER — Telehealth: Payer: Self-pay | Admitting: *Deleted

## 2017-11-01 NOTE — Telephone Encounter (Signed)
Received Medical records from [place]; forwarded to provider/SLS 10/28

## 2017-11-02 ENCOUNTER — Encounter: Payer: Self-pay | Admitting: Family Medicine

## 2017-12-10 ENCOUNTER — Other Ambulatory Visit: Payer: Self-pay | Admitting: Family Medicine

## 2017-12-10 DIAGNOSIS — I10 Essential (primary) hypertension: Secondary | ICD-10-CM

## 2018-06-02 ENCOUNTER — Encounter: Payer: Self-pay | Admitting: Family Medicine

## 2018-06-22 IMAGING — DX DG CHEST 2V
2 series · 2 of 2 positions shown · non-contrast
Comparison: None.

CLINICAL DATA: Chest pain for 2 weeks.  Nonsmoker.

EXAM:
CHEST - 2 VIEW

[chest pa]
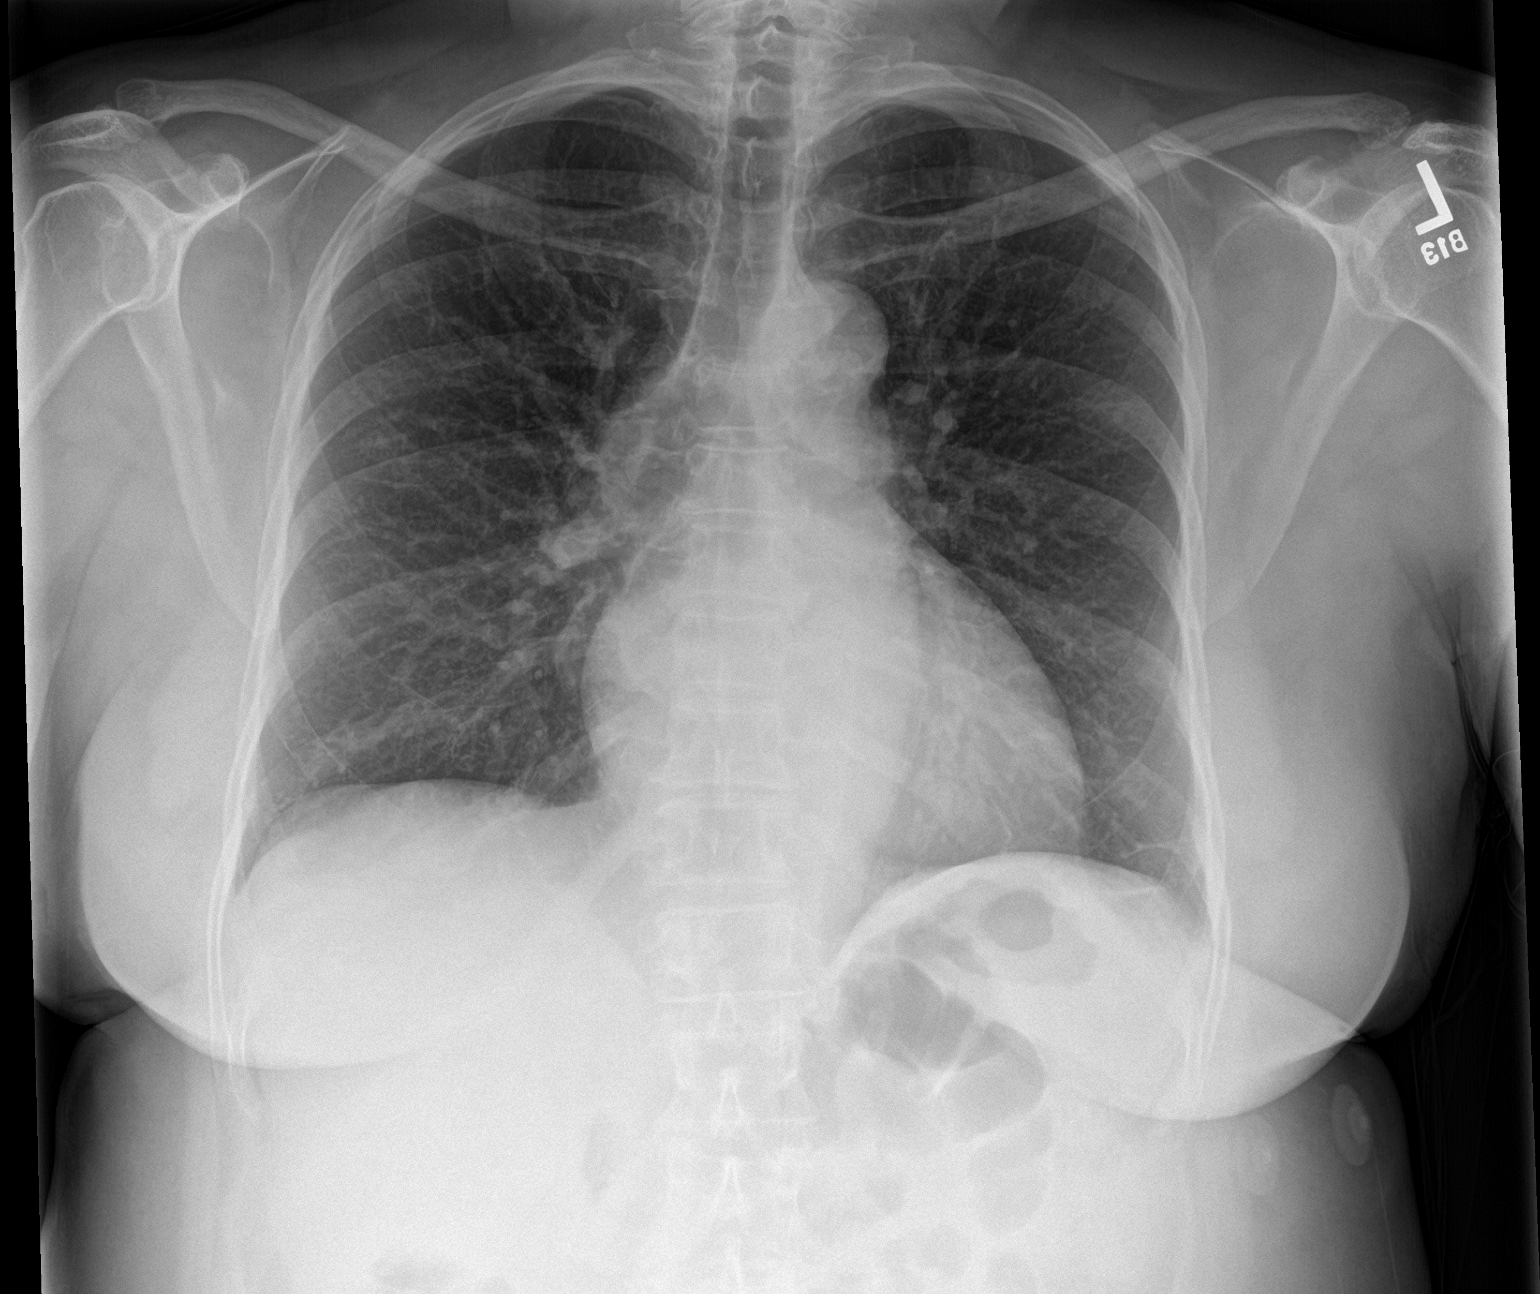

[chest lat]
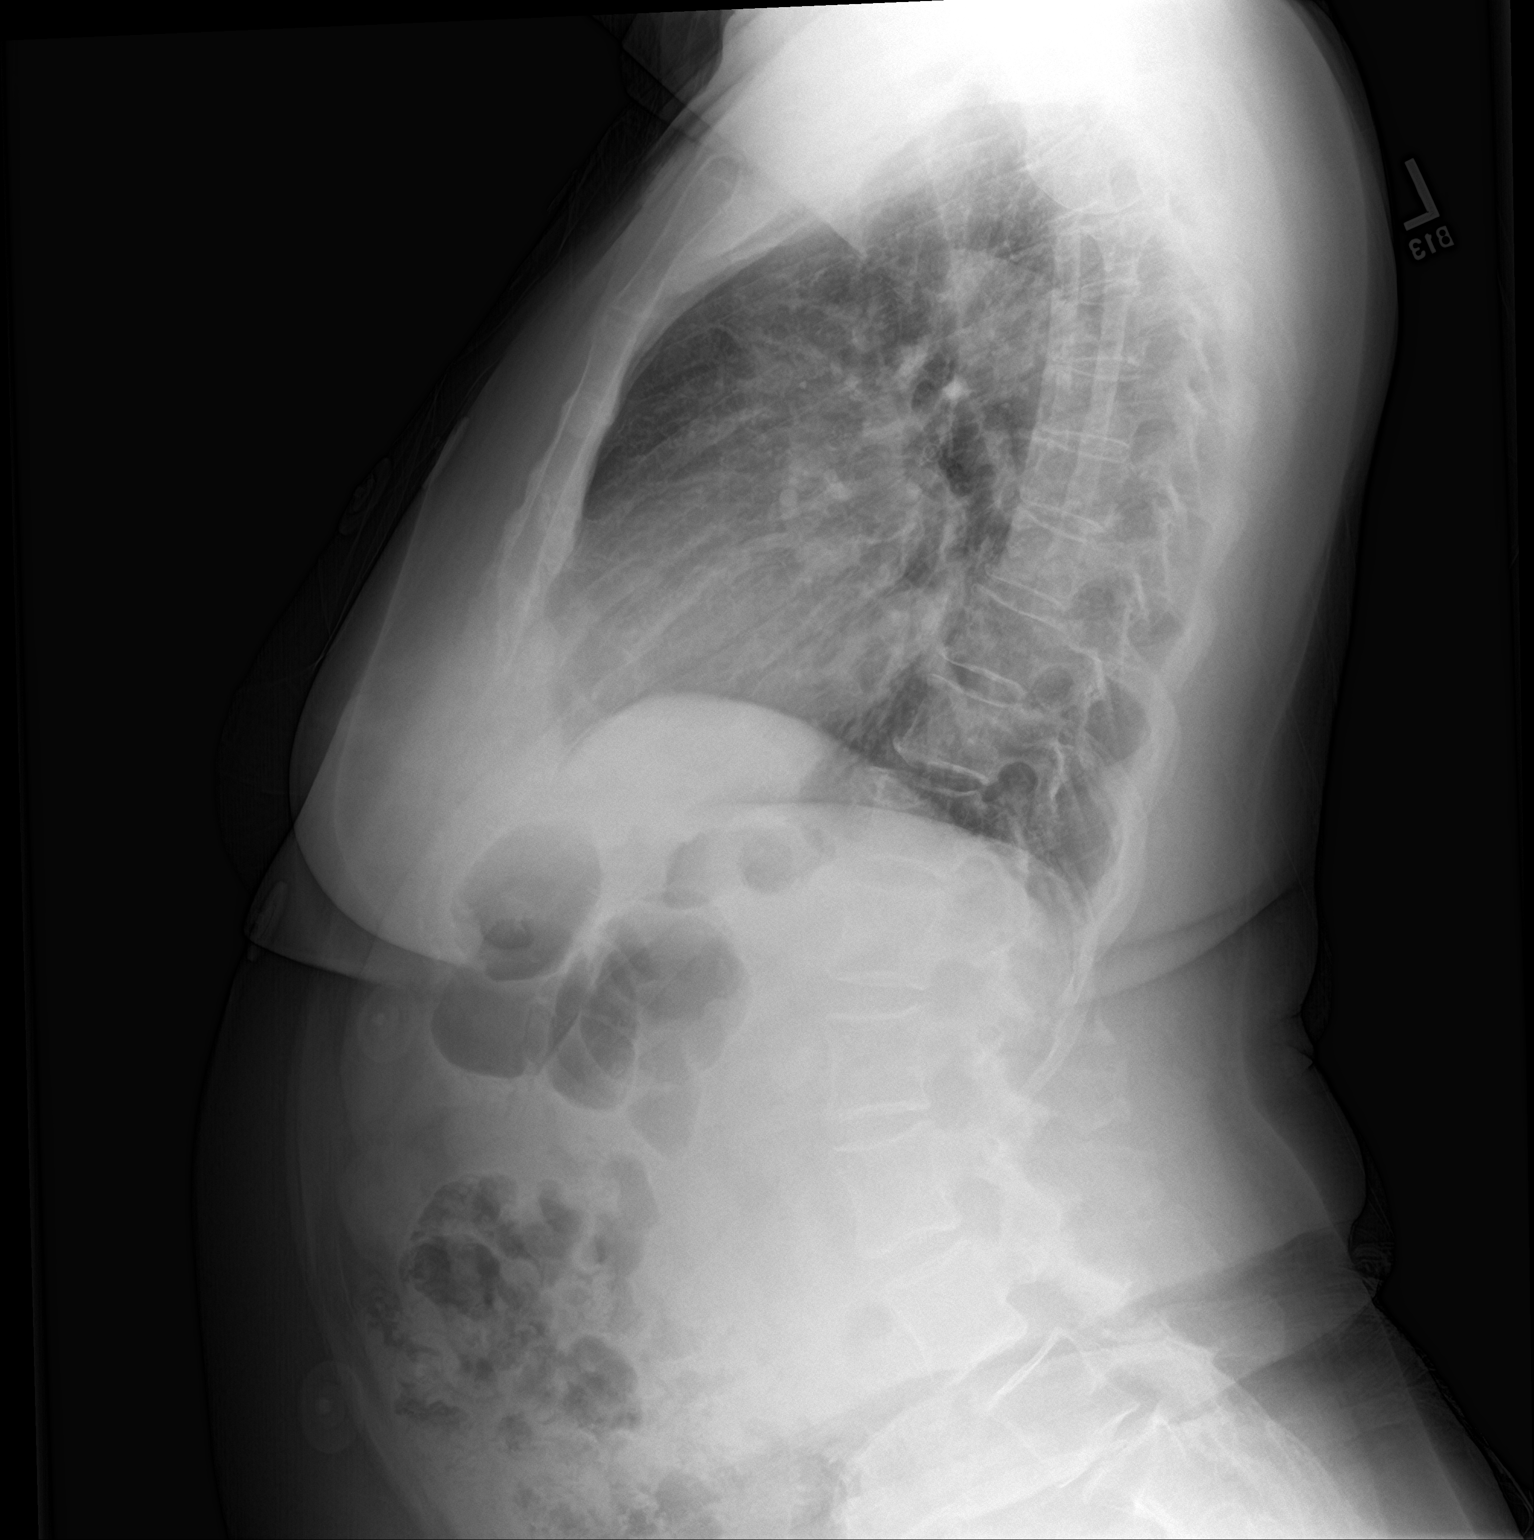

[2 of 2 positions shown; findings below may reference images not displayed]

FINDINGS: The heart size is at the upper limits of normal. The lungs are
clear. There is no pleural effusion or pneumothorax. Telemetry leads
overlie the chest. There is an exaggerated thoracolumbar lordosis
with mild spondylosis. No acute osseous findings are seen.
IMPRESSION: No active cardiopulmonary process.

## 2018-06-24 ENCOUNTER — Encounter: Payer: Self-pay | Admitting: Gastroenterology

## 2018-07-19 ENCOUNTER — Other Ambulatory Visit: Payer: Self-pay | Admitting: Family Medicine

## 2018-07-19 DIAGNOSIS — I10 Essential (primary) hypertension: Secondary | ICD-10-CM

## 2018-07-19 MED ORDER — AMLODIPINE BESY-BENAZEPRIL HCL 5-10 MG PO CAPS: ORAL_CAPSULE | ORAL | 0 refills | Status: DC

## 2018-07-19 NOTE — Telephone Encounter (Signed)
Medication Refill - Medication:   Has amLODipine-benazepril (LOTREL) 5-10 MG capsule [[id number] the patient contacted their pharmacy? Yes.   (Agent: If no, request that the patient contact the pharmacy for the refill.) (Agent: If yes, when and what did the pharmacy advise?)  Preferred Pharmacy (with phone number or street name): [facility] [address]   Agent: Please be advised that RX refills may take up to 3 business days. We ask that you follow-up with your pharmacy.

## 2018-07-19 NOTE — Telephone Encounter (Signed)
Rx sent 

## 2018-07-25 ENCOUNTER — Encounter: Payer: Self-pay | Admitting: Gastroenterology

## 2018-08-17 ENCOUNTER — Other Ambulatory Visit: Payer: Self-pay

## 2018-08-17 ENCOUNTER — Ambulatory Visit (AMBULATORY_SURGERY_CENTER): Payer: Self-pay

## 2018-08-17 VITALS — Temp 96.7°F | Ht 60.0 in | Wt 152.0 lb

## 2018-08-17 DIAGNOSIS — Z1211 Encounter for screening for malignant neoplasm of colon: Secondary | ICD-10-CM

## 2018-08-17 MED ORDER — SUPREP BOWEL PREP KIT 17.5-3.13-1.6 GM/177ML PO SOLN: 1.0000 | Freq: Once | ORAL | 0 refills | Status: AC

## 2018-08-17 NOTE — Progress Notes (Signed)
Per pt, no allergies to soy or egg products.Pt not taking any weight loss meds or using  O2 at home.  Pt states she gets sick past sedation.  Pt refused emmi video.

## 2018-08-23 ENCOUNTER — Other Ambulatory Visit: Payer: Self-pay | Admitting: Family Medicine

## 2018-08-23 DIAGNOSIS — M8580 Other specified disorders of bone density and structure, unspecified site: Secondary | ICD-10-CM

## 2018-08-23 NOTE — Telephone Encounter (Signed)
Medication Refill - Medication: ibandronate (BONIVA) 150 MG tablet [[id number]]    Has the patient contacted their pharmacy?  Preferred Pharmacy (with phone number or street name): [id number] [address], [place]. [phone] (Phone) [phone] (Fax)     Agent: Please be advised that RX refills may take up to 3 business days. We ask that you follow-up with your pharmacy.

## 2018-08-24 ENCOUNTER — Other Ambulatory Visit: Payer: Self-pay | Admitting: *Deleted

## 2018-08-24 DIAGNOSIS — M8580 Other specified disorders of bone density and structure, unspecified site: Secondary | ICD-10-CM

## 2018-08-24 MED ORDER — IBANDRONATE SODIUM 150 MG PO TABS: ORAL_TABLET | ORAL | 0 refills | Status: DC

## 2018-08-26 ENCOUNTER — Encounter: Payer: Self-pay | Admitting: Gastroenterology

## 2018-08-30 ENCOUNTER — Telehealth: Payer: Self-pay | Admitting: Gastroenterology

## 2018-08-30 NOTE — Telephone Encounter (Signed)

## 2018-08-31 ENCOUNTER — Other Ambulatory Visit: Payer: Self-pay

## 2018-08-31 ENCOUNTER — Encounter: Payer: Self-pay | Admitting: Gastroenterology

## 2018-08-31 ENCOUNTER — Ambulatory Visit (AMBULATORY_SURGERY_CENTER): Payer: BC Managed Care – PPO | Admitting: Gastroenterology

## 2018-08-31 VITALS — BP 128/71 | HR 58 | Temp 98.6°F | Resp 18 | Ht 60.0 in | Wt 152.0 lb

## 2018-08-31 DIAGNOSIS — Z1211 Encounter for screening for malignant neoplasm of colon: Secondary | ICD-10-CM

## 2018-08-31 MED ORDER — SODIUM CHLORIDE 0.9 % IV SOLN: 500.0000 mL | Freq: Once | INTRAVENOUS | Status: DC

## 2018-08-31 NOTE — Progress Notes (Signed)
Pt's states no medical or surgical changes since previsit or office visit. Covid screening, temp done by June. VS done by [facility]

## 2018-08-31 NOTE — Op Note (Signed)
[facility] Patient Name: Susan Torres Procedure Date: 08/31/2018 10:54 AM MRN: [id number] Endoscopist: Mauri Pole , MD Age: 63 Referring MD:  Date of Birth: 10-08-1955 Gender: Female Account #: [id number] Procedure:                Colonoscopy Indications:              Screening for colorectal malignant neoplasm Medicines:                Monitored Anesthesia Care Procedure:                Pre-Anesthesia Assessment:                           - Prior to the procedure, a History and Physical                            was performed, and patient medications and                            allergies were reviewed. The patient's tolerance of                            previous anesthesia was also reviewed. The risks                            and benefits of the procedure and the sedation                            options and risks were discussed with the patient.                            All questions were answered, and informed consent                            was obtained. Prior Anticoagulants: The patient has                            taken no previous anticoagulant or antiplatelet                            agents. ASA Grade Assessment: II - A patient with                            mild systemic disease. After reviewing the risks                            and benefits, the patient was deemed in                            satisfactory condition to undergo the procedure.                           After obtaining informed consent, the colonoscope  was passed under direct vision. Throughout the                            procedure, the patient's blood pressure, pulse, and                            oxygen saturations were monitored continuously. The                            Colonoscope was introduced through the anus and                            advanced to the the cecum, identified by                            appendiceal orifice  and ileocecal valve. The                            colonoscopy was performed without difficulty. The                            patient tolerated the procedure well. The quality                            of the bowel preparation was excellent. The                            ileocecal valve, appendiceal orifice, and rectum                            were photographed. Scope In: 10:57:03 AM Scope Out: 11:10:34 AM Scope Withdrawal Time: 0 hours 7 minutes 35 seconds  Total Procedure Duration: 0 hours 13 minutes 31 seconds  Findings:                 The digital rectal exam findings include decreased                            sphincter tone.                           Scattered small-mouthed diverticula were found in                            the sigmoid colon, descending colon, transverse                            colon and ascending colon. There was evidence of an                            impacted diverticulum.                           Non-bleeding external internal hemorrhoids were  found during retroflexion. The hemorrhoids were                            medium-sized. Complications:            No immediate complications. Estimated Blood Loss:     Estimated blood loss: none. Impression:               - Decreased sphincter tone found on digital rectal                            exam.                           - Mild diverticulosis in the sigmoid colon, in the                            descending colon, in the transverse colon and in                            the ascending colon. There was evidence of an                            impacted diverticulum.                           - Non-bleeding external internal hemorrhoids.                           - No specimens collected. Recommendation:           - Patient has a contact number available for                            emergencies. The signs and symptoms of potential                            delayed  complications were discussed with the                            patient. Return to normal activities tomorrow.                            Written discharge instructions were provided to the                            patient.                           - Resume previous diet.                           - Continue present medications.                           - Repeat colonoscopy in 10 years for screening  purposes. Mauri Pole, MD 08/31/2018 11:15:10 AM This report has been signed electronically.

## 2018-08-31 NOTE — Progress Notes (Signed)
PT taken to PACU. Monitors in place. VSS. Report given to RN. 

## 2018-08-31 NOTE — Patient Instructions (Signed)
YOU HAD AN ENDOSCOPIC PROCEDURE TODAY AT [facility]:   Refer to the procedure report that was given to you for any specific questions about what was found during the examination.  If the procedure report does not answer your questions, please call your gastroenterologist to clarify.  If you requested that your care partner not be given the details of your procedure findings, then the procedure report has been included in a sealed envelope for you to review at your convenience later.  YOU SHOULD EXPECT: Some feelings of bloating in the abdomen. Passage of more gas than usual.  Walking can help get rid of the air that was put into your GI tract during the procedure and reduce the bloating. If you had a lower endoscopy (such as a colonoscopy or flexible sigmoidoscopy) you may notice spotting of blood in your stool or on the toilet paper. If you underwent a bowel prep for your procedure, you may not have a normal bowel movement for a few days.  Please Note:  You might notice some irritation and congestion in your nose or some drainage.  This is from the oxygen used during your procedure.  There is no need for concern and it should clear up in a day or so.  SYMPTOMS TO REPORT IMMEDIATELY:   Following lower endoscopy (colonoscopy or flexible sigmoidoscopy):  Excessive amounts of blood in the stool  Significant tenderness or worsening of abdominal pains  Swelling of the abdomen that is new, acute  Fever of 100F or higher  For urgent or emergent issues, a gastroenterologist can be reached at any hour by calling [phone].   DIET:  We do recommend a small meal at first, but then you may proceed to your regular diet.  Drink plenty of fluids but you should avoid alcoholic beverages for 24 hours.  ACTIVITY:  You should plan to take it easy for the rest of today and you should NOT DRIVE or use heavy machinery until tomorrow (because of the sedation medicines used during the test).     FOLLOW UP: Our staff will call the number listed on your records 48-72 hours following your procedure to check on you and address any questions or concerns that you may have regarding the information given to you following your procedure. If we do not reach you, we will leave a message.  We will attempt to reach you two times.  During this call, we will ask if you have developed any symptoms of COVID 19. If you develop any symptoms (ie: fever, flu-like symptoms, shortness of breath, cough etc.) before then, please call [phone].  If you test positive for Covid 19 in the 2 weeks post procedure, please call and report this information to [place].      SIGNATURES/CONFIDENTIALITY: You and/or your care partner have signed paperwork which will be entered into your electronic medical record.  These signatures attest to the fact that that the information above on your After Visit Summary has been reviewed and is understood.  Full responsibility of the confidentiality of this discharge information lies with you and/or your care-partner.  Next colonoscopy-10 years  Please read over handouts about diverticulosis and hemorrhoids  Continue your normal medications

## 2018-09-02 ENCOUNTER — Telehealth: Payer: Self-pay

## 2018-09-02 ENCOUNTER — Telehealth: Payer: Self-pay | Admitting: *Deleted

## 2018-09-02 NOTE — Telephone Encounter (Signed)
  Follow up Call-  Call back number 08/31/2018  Post procedure Call Back phone  # [phone]  Permission to leave phone message Yes  Some recent data might be hidden    [facility]

## 2018-09-02 NOTE — Telephone Encounter (Signed)
  Follow up Call-  Call back number 08/31/2018  Post procedure Call Back phone  # [phone]  Permission to leave phone message Yes  Some recent data might be hidden     Patient questions:  Do you have a fever, pain , or abdominal swelling? No. Pain Score  0 *  Have you tolerated food without any problems? Yes.    Have you been able to return to your normal activities? Yes.    Do you have any questions about your discharge instructions: Diet   No. Medications  No. Follow up visit  No.  Do you have questions or concerns about your Care? No.  Actions: * If pain score is 4 or above: No action needed, pain <4.  1. Have you developed a fever since your procedure? no  2.   Have you had an respiratory symptoms (SOB or cough) since your procedure? no  3.   Have you tested positive for COVID 19 since your procedure no  4.   Have you had any family members/close contacts diagnosed with the COVID 19 since your procedure?  no   If yes to any of these questions please route to Joylene John, RN and Alphonsa Gin, Therapist, sports.

## 2018-09-15 ENCOUNTER — Encounter: Payer: Self-pay | Admitting: Family Medicine

## 2018-09-15 NOTE — Telephone Encounter (Signed)
We do not do covid test here because we are not seeing sick people in the office--- [facility]  why does she want it done ?

## 2018-09-22 ENCOUNTER — Other Ambulatory Visit: Payer: Self-pay | Admitting: *Deleted

## 2018-09-22 DIAGNOSIS — E785 Hyperlipidemia, unspecified: Secondary | ICD-10-CM

## 2018-09-22 MED ORDER — PRAVASTATIN SODIUM 40 MG PO TABS: 40.0000 mg | ORAL_TABLET | Freq: Every day | ORAL | 0 refills | Status: DC

## 2018-09-26 ENCOUNTER — Encounter: Payer: Self-pay | Admitting: Family Medicine

## 2018-09-26 ENCOUNTER — Other Ambulatory Visit: Payer: Self-pay

## 2018-09-26 ENCOUNTER — Ambulatory Visit (INDEPENDENT_AMBULATORY_CARE_PROVIDER_SITE_OTHER): Payer: BC Managed Care – PPO | Admitting: Family Medicine

## 2018-09-26 VITALS — BP 132/86 | HR 73 | Temp 97.5°F | Resp 18 | Ht 60.0 in | Wt 151.4 lb

## 2018-09-26 DIAGNOSIS — M159 Polyosteoarthritis, unspecified: Secondary | ICD-10-CM

## 2018-09-26 DIAGNOSIS — I10 Essential (primary) hypertension: Secondary | ICD-10-CM | POA: Diagnosis not present

## 2018-09-26 DIAGNOSIS — M8589 Other specified disorders of bone density and structure, multiple sites: Secondary | ICD-10-CM

## 2018-09-26 DIAGNOSIS — F419 Anxiety disorder, unspecified: Secondary | ICD-10-CM

## 2018-09-26 DIAGNOSIS — E785 Hyperlipidemia, unspecified: Secondary | ICD-10-CM

## 2018-09-26 DIAGNOSIS — M150 Primary generalized (osteo)arthritis: Secondary | ICD-10-CM | POA: Diagnosis not present

## 2018-09-26 DIAGNOSIS — Z23 Encounter for immunization: Secondary | ICD-10-CM

## 2018-09-26 DIAGNOSIS — Z0000 Encounter for general adult medical examination without abnormal findings: Secondary | ICD-10-CM | POA: Diagnosis not present

## 2018-09-26 DIAGNOSIS — M8580 Other specified disorders of bone density and structure, unspecified site: Secondary | ICD-10-CM

## 2018-09-26 MED ORDER — PRAVASTATIN SODIUM 40 MG PO TABS: 40.0000 mg | ORAL_TABLET | Freq: Every day | ORAL | 1 refills | Status: DC

## 2018-09-26 MED ORDER — IBANDRONATE SODIUM 150 MG PO TABS: ORAL_TABLET | ORAL | 3 refills | Status: DC

## 2018-09-26 MED ORDER — ALPRAZOLAM 0.25 MG PO TABS: 0.2500 mg | ORAL_TABLET | Freq: Two times a day (BID) | ORAL | 1 refills | Status: DC | PRN

## 2018-09-26 MED ORDER — AMLODIPINE BESY-BENAZEPRIL HCL 5-10 MG PO CAPS: ORAL_CAPSULE | ORAL | 1 refills | Status: DC

## 2018-09-26 NOTE — Patient Instructions (Signed)

## 2018-09-26 NOTE — Progress Notes (Signed)
Subjective:     Susan Torres is a 63 y.o. female and is here for a comprehensive physical exam. The patient reports no problems.  She needs refills and b/u bp and labs  Social History   Socioeconomic History  . Marital status: Single    Spouse name: Not on file  . Number of children: Not on file  . Years of education: Not on file  . Highest education level: Not on file  Occupational History  . Occupation: [employer]  Social Needs  . Financial resource strain: Not on file  . Food insecurity    Worry: Not on file    Inability: Not on file  . Transportation needs    Medical: Not on file    Non-medical: Not on file  Tobacco Use  . Smoking status: Passive Smoke Exposure - Never Smoker  . Smokeless tobacco: Never Used  Substance and Sexual Activity  . Alcohol use: Yes    Alcohol/week: 2.0 standard drinks    Types: 2 Glasses of wine per week  . Drug use: No  . Sexual activity: Not Currently    Partners: Male  Lifestyle  . Physical activity    Days per week: Not on file    Minutes per session: Not on file  . Stress: Not on file  Relationships  . Social Herbalist on phone: Not on file    Gets together: Not on file    Attends religious service: Not on file    Active member of club or organization: Not on file    Attends meetings of clubs or organizations: Not on file    Relationship status: Not on file  . Intimate partner violence    Fear of current or ex partner: Not on file    Emotionally abused: Not on file    Physically abused: Not on file    Forced sexual activity: Not on file  Other Topics Concern  . Not on file  Social History Narrative  . Not on file   Health Maintenance  Topic Date Due  . MAMMOGRAM  05/06/2016  . TETANUS/TDAP  04/24/2018  . PAP SMEAR-Modifier  10/01/2019  . COLONOSCOPY  08/30/2028  . INFLUENZA VACCINE  Completed  . Hepatitis C Screening  Completed  . HIV Screening  Completed    The following portions of the patient's  history were reviewed and updated as appropriate:  She  has a past medical history of Allergy, Arthritis, Hyperlipidemia, Hypertension, Post-operative nausea and vomiting, and Uterine fibroid. She does not have any pertinent problems on file. She  has a past surgical history that includes Eye surgery (2005); Knee surgery (2002); and Breast cyst excision. Her family history includes Alcohol abuse in her father; Alcoholism in her brother; Diabetes in her brother and mother; Hypertension in her mother; Kidney failure in her brother; Prostate cancer in her brother; Stomach cancer in her father. She  reports that she is a non-smoker but has been exposed to tobacco smoke. She has never used smokeless tobacco. She reports current alcohol use of about 2.0 standard drinks of alcohol per week. She reports that she does not use drugs. She has a current medication list which includes the following prescription(s): alprazolam, amlodipine-benazepril, aspirin ec, b complex vitamins, calcium carbonate-vit d-min, celecoxib, cetirizine, vitamin d3, fluticasone, green tea, hydrocodone-acetaminophen, multivitamin, pravastatin, prempro, turmeric, hydroxyzine, and ibandronate. Current Outpatient Medications on File Prior to Visit  Medication Sig Dispense Refill  . aspirin EC 81 MG tablet Take  81 mg by mouth daily.    Marland Kitchen b complex vitamins tablet Take 1 tablet by mouth daily.    . Calcium Carbonate-Vit D-Min (CALCIUM 1200 PO) Take by mouth daily.    . celecoxib (CELEBREX) 200 MG capsule TAKE ONE CAPSULE BY MOUTH TWICE A DAY 180 capsule 2  . cetirizine (ZYRTEC) 10 MG tablet Take 1 tablet (10 mg total) by mouth daily. 90 tablet 2  . Cholecalciferol (VITAMIN D3) 50 MCG (2000 UT) TABS Take by mouth. Take 3 daily    . fluticasone (FLONASE) 50 MCG/ACT nasal spray Place 1 spray into both nostrils as needed for allergies or rhinitis.    Nyoka Cowden Tea 150 MG CAPS Take by mouth every other day.    Marland Kitchen HYDROcodone-acetaminophen  (NORCO/VICODIN) 5-325 MG per tablet Take 0.5 tablets by mouth every 6 (six) hours as needed for moderate pain. 30 tablet 0  . Multiple Vitamin (MULTIVITAMIN) tablet Take 1 tablet by mouth daily.    Marland Kitchen PREMPRO 0.3-1.5 MG per tablet Take 1 tablet by mouth daily.     . Turmeric 450 MG CAPS Take by mouth daily.    . hydrOXYzine (ATARAX/VISTARIL) 25 MG tablet Take 1 tablet (25 mg total) by mouth every 8 (eight) hours as needed for anxiety or nausea. 12 tablet 0   No current facility-administered medications on file prior to visit.    She has No Known Allergies..  Review of Systems Review of Systems  Constitutional: Negative for activity change, appetite change and fatigue.  HENT: Negative for hearing loss, congestion, tinnitus and ear discharge.  dentist q31m Eyes: Negative for visual disturbance (see optho q1y -- vision corrected to 20/20 with glasses).  Respiratory: Negative for cough, chest tightness and shortness of breath.   Cardiovascular: Negative for chest pain, palpitations and leg swelling.  Gastrointestinal: Negative for abdominal pain, diarrhea, constipation and abdominal distention.  Genitourinary: Negative for urgency, frequency, decreased urine volume and difficulty urinating.  Musculoskeletal: Negative for back pain, arthralgias and gait problem.  Skin: Negative for color change, pallor and rash.  Neurological: Negative for dizziness, light-headedness, numbness and headaches.  Hematological: Negative for adenopathy. Does not bruise/bleed easily.  Psychiatric/Behavioral: Negative for suicidal ideas, confusion, sleep disturbance, self-injury, dysphoric mood, decreased concentration and agitation.       Objective:    BP 132/86 (BP Location: Right Arm, Patient Position: Sitting, Cuff Size: Normal)   Pulse 73   Temp (!) 97.5 F (36.4 C) (Temporal)   Resp 18   Ht 5' (1.524 m)   Wt 151 lb 6.4 oz (68.7 kg)   SpO2 99%   BMI 29.57 kg/m  General appearance: alert, cooperative,  appears stated age and no distress Head: Normocephalic, without obvious abnormality, atraumatic Eyes: negative findings: lids and lashes normal, conjunctivae and sclerae normal and pupils equal, round, reactive to light and accomodation Ears: normal TM's and external ear canals both ears Nose: Nares normal. Septum midline. Mucosa normal. No drainage or sinus tenderness. Throat: lips, mucosa, and tongue normal; teeth and gums normal Neck: no adenopathy, no carotid bruit, no JVD, supple, symmetrical, trachea midline and thyroid not enlarged, symmetric, no tenderness/mass/nodules Back: symmetric, no curvature. ROM normal. No CVA tenderness. Lungs: clear to auscultation bilaterally Breasts: gyn Heart: regular rate and rhythm, S1, S2 normal, no murmur, click, rub or gallop Abdomen: soft, non-tender; bowel sounds normal; no masses,  no organomegaly Pelvic: deferred -gyn Extremities: extremities normal, atraumatic, no cyanosis or edema Pulses: 2+ and symmetric Skin: Skin color, texture, turgor normal. No  rashes or lesions Lymph nodes: Cervical, supraclavicular, and axillary nodes normal. Neurologic: Alert and oriented X 3, normal strength and tone. Normal symmetric reflexes. Normal coordination and gait    Assessment:    Healthy female exam.      Plan:    ghm utd Check labs  See After Visit Summary for Counseling Recommendations    1. Anxiety stable - ALPRAZolam (XANAX) 0.25 MG tablet; Take 1 tablet (0.25 mg total) by mouth 2 (two) times daily as needed for anxiety.  Dispense: 20 tablet; Refill: 1  2. Essential hypertension Well controlled, no changes to meds. Encouraged heart healthy diet such as the DASH diet and exercise as tolerated.   - amLODipine-benazepril (LOTREL) 5-10 MG capsule; TAKE 1 CAPSULE BY MOUTH EVERY DAY  Dispense: 90 capsule; Refill: 1 - CBC with Differential/Platelet - Comprehensive metabolic panel - Lipid panel - TSH  3. Hyperlipidemia Encouraged heart healthy  diet, increase exercise, avoid trans fats, consider a krill oil cap daily - pravastatin (PRAVACHOL) 40 MG tablet; Take 1 tablet (40 mg total) by mouth daily.  Dispense: 90 tablet; Refill: 1 - CBC with Differential/Platelet - Comprehensive metabolic panel - Lipid panel - TSH  4. Primary osteoarthritis involving multiple joints Stable F/u ortho  5. Preventative health care See above  - CBC with Differential/Platelet - Comprehensive metabolic panel - Lipid panel - TSH  6. Need for influenza vaccination   - Flu Vaccine QUAD 36+ mos IM  7. Need for tetanus booster   - Tdap vaccine greater than or equal to 7yo IM  8. Osteopenia of multiple sites On boniva  - DG Bone Density; Future  9. Osteopenia   - ibandronate (BONIVA) 150 MG tablet; TAKE 1 TABLET BY MOUTH EVERY 30 DAYS.  Dispense: 3 tablet; Refill: 3

## 2018-09-27 LAB — COMPREHENSIVE METABOLIC PANEL
AG Ratio: 1.5 (calc) (ref 1.0–2.5)
ALT: 16 U/L (ref 6–29)
AST: 16 U/L (ref 10–35)
Albumin: 4.4 g/dL (ref 3.6–5.1)
Alkaline phosphatase (APISO): 67 U/L (ref 37–153)
BUN: 9 mg/dL (ref 7–25)
CO2: 25 mmol/L (ref 20–32)
Calcium: 9.7 mg/dL (ref 8.6–10.4)
Chloride: 104 mmol/L (ref 98–110)
Creat: 0.69 mg/dL (ref 0.50–0.99)
Globulin: 2.9 g/dL (calc) (ref 1.9–3.7)
Glucose, Bld: 84 mg/dL (ref 65–99)
Potassium: 4.2 mmol/L (ref 3.5–5.3)
Sodium: 140 mmol/L (ref 135–146)
Total Bilirubin: 0.4 mg/dL (ref 0.2–1.2)
Total Protein: 7.3 g/dL (ref 6.1–8.1)

## 2018-09-27 LAB — CBC WITH DIFFERENTIAL/PLATELET
Absolute Monocytes: 488 cells/uL (ref 200–950)
Basophils Absolute: 64 cells/uL (ref 0–200)
Basophils Relative: 0.8 %
Eosinophils Absolute: 312 cells/uL (ref 15–500)
Eosinophils Relative: 3.9 %
HCT: 46.5 % — ABNORMAL HIGH (ref 35.0–45.0)
Hemoglobin: 15.2 g/dL (ref 11.7–15.5)
Lymphs Abs: 1616 cells/uL (ref 850–3900)
MCH: 28.7 pg (ref 27.0–33.0)
MCHC: 32.7 g/dL (ref 32.0–36.0)
MCV: 87.9 fL (ref 80.0–100.0)
MPV: 10.3 fL (ref 7.5–12.5)
Monocytes Relative: 6.1 %
Neutro Abs: 5520 cells/uL (ref 1500–7800)
Neutrophils Relative %: 69 %
Platelets: 314 10*3/uL (ref 140–400)
RBC: 5.29 10*6/uL — ABNORMAL HIGH (ref 3.80–5.10)
RDW: 13.1 % (ref 11.0–15.0)
Total Lymphocyte: 20.2 %
WBC: 8 10*3/uL (ref 3.8–10.8)

## 2018-09-27 LAB — LIPID PANEL
Cholesterol: 143 mg/dL (ref <200)
HDL: 62 mg/dL (ref >=50)
LDL Cholesterol (Calc): 63 mg/dL (calc)
Non-HDL Cholesterol (Calc): 81 mg/dL (calc) (ref <130)
Total CHOL/HDL Ratio: 2.3 (calc) (ref <5.0)
Triglycerides: 95 mg/dL (ref <150)

## 2018-09-27 LAB — TSH: TSH: 1.94 mIU/L (ref 0.40–4.50)

## 2018-10-04 ENCOUNTER — Encounter: Payer: Self-pay | Admitting: Emergency Medicine

## 2018-10-04 ENCOUNTER — Ambulatory Visit
Admission: EM | Admit: 2018-10-04 | Discharge: 2018-10-04 | Disposition: A | Discharge status: Home / Self Care | Payer: BC Managed Care – PPO | Attending: Physician Assistant | Admitting: Physician Assistant

## 2018-10-04 ENCOUNTER — Other Ambulatory Visit: Payer: Self-pay

## 2018-10-04 DIAGNOSIS — Z20828 Contact with and (suspected) exposure to other viral communicable diseases: Secondary | ICD-10-CM | POA: Diagnosis not present

## 2018-10-04 DIAGNOSIS — Z20822 Contact with and (suspected) exposure to covid-19: Secondary | ICD-10-CM

## 2018-10-04 NOTE — ED Notes (Signed)
Patient able to ambulate independently  

## 2018-10-04 NOTE — ED Provider Notes (Signed)
[name]    CSN: [id number] Arrival date & time: 10/04/18  1502      History   Chief Complaint Chief Complaint  Patient presents with  . COVID Exposure    HPI Susan Torres is a 63 y.o. female.   63 year old female comes in for cover testing after exposure.  States was exposed to coworkers were tested positive for Susan Torres.  She is asymptomatic.  Denies fever, chills, body aches.  Denies URI symptoms such as cough, congestion, sore throat.  Denies abdominal pain, nausea, vomiting, diarrhea.  Denies shortness of breath, loss of taste or smell.  Patient states has elderly mother that she is trying to visit, but would like to refrain until COVID testing results.     Past Medical History:  Diagnosis Date  . Allergy   . Arthritis    all over in joints  . Hyperlipidemia   . Hypertension   . Post-operative nausea and vomiting   . Uterine fibroid     Patient Active Problem List   Diagnosis Date Noted  . Preventative health care 04/30/2016  . Osteopenia 02/20/2013  . Hypoglycemia 11/06/2011  . HAND PAIN, BILATERAL 05/15/2009  . DIZZINESS 09/26/2008  . NAUSEA AND VOMITING 09/26/2008  . Hyperlipidemia 08/05/2007  . MYALGIA 08/05/2007  . Essential hypertension 03/17/2007  . ALLERGIC RHINITIS 03/17/2007    Past Surgical History:  Procedure Laterality Date  . BREAST CYST EXCISION     Rght Breast/benign  . EYE SURGERY  2005   Right Eye  . KNEE SURGERY  2002   Bilateral    OB History   No obstetric history on file.      Home Medications    Prior to Admission medications   Medication Sig Start Date End Date Taking? Authorizing Provider  ALPRAZolam (XANAX) 0.25 MG tablet Take 1 tablet (0.25 mg total) by mouth 2 (two) times daily as needed for anxiety. 09/26/18   Carollee Herter, Kendrick Fries R, DO  amLODipine-benazepril (LOTREL) 5-10 MG capsule TAKE 1 CAPSULE BY MOUTH EVERY DAY 09/26/18   Carollee Herter, Alferd Apa, DO  aspirin EC 81 MG tablet Take 81 mg by mouth  daily.    [provider]  b complex vitamins tablet Take 1 tablet by mouth daily.    [provider]  Calcium Carbonate-Vit D-Min (CALCIUM 1200 PO) Take by mouth daily.    [provider]  celecoxib (CELEBREX) 200 MG capsule TAKE ONE CAPSULE BY MOUTH TWICE A DAY 07/27/16   Carollee Herter, Alferd Apa, DO  cetirizine (ZYRTEC) 10 MG tablet Take 1 tablet (10 mg total) by mouth daily. 04/30/16   Ann Held, DO  Cholecalciferol (VITAMIN D3) 50 MCG (2000 UT) TABS Take by mouth. Take 3 daily    [provider]  fluticasone (FLONASE) 50 MCG/ACT nasal spray Place 1 spray into both nostrils as needed for allergies or rhinitis.    [provider]  Nyoka Cowden Tea 150 MG CAPS Take by mouth every other day.    [provider]  HYDROcodone-acetaminophen (NORCO/VICODIN) 5-325 MG per tablet Take 0.5 tablets by mouth every 6 (six) hours as needed for moderate pain. 08/17/13   Roma Schanz R, DO  ibandronate (BONIVA) 150 MG tablet TAKE 1 TABLET BY MOUTH EVERY 30 DAYS. 09/26/18   Carollee Herter, Alferd Apa, DO  Multiple Vitamin (MULTIVITAMIN) tablet Take 1 tablet by mouth daily.    [provider]  pravastatin (PRAVACHOL) 40 MG tablet Take 1 tablet (40 mg  total) by mouth daily. 09/26/18   Lowne Chase, Yvonne R, DO  PREMPRO 0.3-1.5 MG per tablet Take 1 tablet by mouth daily.  08/30/10   [provider]  Turmeric 450 MG CAPS Take by mouth daily.    [provider]    Family History Family History  Problem Relation Age of Onset  . Stomach cancer Father   . Alcohol abuse Father   . Hypertension Mother   . Diabetes Mother   . Prostate cancer Brother   . Kidney failure Brother   . Alcoholism Brother   . Diabetes Brother     Social History Social History   Tobacco Use  . Smoking status: Passive Smoke Exposure - Never Smoker  . Smokeless tobacco: Never Used  Substance Use Topics  . Alcohol use: Yes    Alcohol/week: 2.0 standard  drinks    Types: 2 Glasses of wine per week  . Drug use: No     Allergies   Patient has no known allergies.   Review of Systems Review of Systems  Reason unable to perform ROS: See HPI as above.     Physical Exam Triage Vital Signs ED Triage Vitals  Enc Vitals Group     BP 10/04/18 1534 (!) 152/90     Pulse Rate 10/04/18 1534 69     Resp 10/04/18 1534 16     Temp 10/04/18 1534 98.3 F (36.8 C)     Temp Source 10/04/18 1534 Oral     SpO2 10/04/18 1534 97 %     Weight --      Height --      Head Circumference --      Peak Flow --      Pain Score 10/04/18 1535 0     Pain Loc --      Pain Edu? --      Excl. in [place]? --    No data found.  Updated Vital Signs BP (!) 152/90 (BP Location: Left Arm)   Pulse 69   Temp 98.3 F (36.8 C) (Oral)   Resp 16   SpO2 97%   Physical Exam Constitutional:      General: She is not in acute distress.    Appearance: Normal appearance. She is not ill-appearing, toxic-appearing or diaphoretic.  HENT:     Head: Normocephalic and atraumatic.     Mouth/Throat:     Mouth: Mucous membranes are moist.     Pharynx: Oropharynx is clear. Uvula midline.  Neck:     Musculoskeletal: Normal range of motion and neck supple.  Cardiovascular:     Rate and Rhythm: Normal rate and regular rhythm.     Heart sounds: Normal heart sounds. No murmur. No friction rub. No gallop.   Pulmonary:     Effort: Pulmonary effort is normal. No accessory muscle usage, prolonged expiration, respiratory distress or retractions.     Comments: Lungs clear to auscultation without adventitious lung sounds. Neurological:     General: No focal deficit present.     Mental Status: She is alert and oriented to person, place, and time.      UC Treatments / Results  Labs (all labs ordered are listed, but only abnormal results are displayed) Labs Reviewed  NOVEL CORONAVIRUS, NAA    EKG   Radiology No results found.  Procedures Procedures (including critical  care time)  Medications Ordered in UC Medications - No data to display  Initial Impression / Assessment and Plan / UC Course  I have reviewed the triage vital signs and the nursing notes.  Pertinent labs & imaging results that were available during my care of the patient were reviewed by me and considered in my medical decision making (see chart for details).    Discussed with patient, given positive exposure without symptoms, could still be within incubation period. If develop symptoms, may need retesting.  Patient expresses understanding and would like to proceed with testing.  COVID testing ordered.  Patient will continue to monitor symptoms.  To self quarantine if develop any symptoms.  Return precautions given.  Final Clinical Impressions(s) / UC Diagnoses   Final diagnoses:  Exposure to Covid-19 Virus   ED Prescriptions    None     PDMP not reviewed this encounter.   Ok Edwards, PA-C 10/04/18 1558

## 2018-10-04 NOTE — Discharge Instructions (Signed)
COVID testing ordered. As discussed, given recent exposure without symptoms, you may still be in incubation period. Monitor for any symptoms such as cough, congestion, shortness of breath, loss of taste/smell, fever, to start self quarantine and may need retesting. Go to the emergency department for further evaluation if you develop significant shortness of breath, cannot speak in full sentences.  

## 2018-10-04 NOTE — ED Triage Notes (Signed)
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  Patient has an elderly mother who she is afraid to be around until she knows her COVID status.  Patient made aware of need for quarantine until results available, and also made aware of possibility of needing to be retested if she develops symptoms (denies any symptoms at this time) due to how close the exposure was.  Patient is still choosing to be tested today.

## 2018-10-05 LAB — NOVEL CORONAVIRUS, NAA: SARS-CoV-2, NAA: NOT DETECTED

## 2018-10-10 ENCOUNTER — Other Ambulatory Visit: Payer: Self-pay

## 2018-10-10 ENCOUNTER — Ambulatory Visit (INDEPENDENT_AMBULATORY_CARE_PROVIDER_SITE_OTHER)
Admission: RE | Admit: 2018-10-10 | Discharge: 2018-10-10 | Disposition: A | Discharge status: Home / Self Care | Payer: BC Managed Care – PPO | Source: Ambulatory Visit | Attending: Family Medicine | Admitting: Family Medicine

## 2018-10-10 ENCOUNTER — Other Ambulatory Visit (HOSPITAL_COMMUNITY): Payer: BC Managed Care – PPO

## 2018-10-10 DIAGNOSIS — M8589 Other specified disorders of bone density and structure, multiple sites: Secondary | ICD-10-CM | POA: Diagnosis not present

## 2018-10-12 DIAGNOSIS — M8589 Other specified disorders of bone density and structure, multiple sites: Secondary | ICD-10-CM | POA: Diagnosis not present

## 2018-10-18 ENCOUNTER — Other Ambulatory Visit: Payer: Self-pay

## 2018-10-18 MED ORDER — ALENDRONATE SODIUM 70 MG PO TABS: 70.0000 mg | ORAL_TABLET | ORAL | 3 refills | Status: DC

## 2019-03-25 ENCOUNTER — Other Ambulatory Visit: Payer: Self-pay | Admitting: Family Medicine

## 2019-03-25 DIAGNOSIS — I10 Essential (primary) hypertension: Secondary | ICD-10-CM

## 2019-06-02 ENCOUNTER — Other Ambulatory Visit: Payer: Self-pay | Admitting: Family Medicine

## 2019-06-02 DIAGNOSIS — E785 Hyperlipidemia, unspecified: Secondary | ICD-10-CM

## 2019-06-15 ENCOUNTER — Ambulatory Visit
Admission: EM | Admit: 2019-06-15 | Discharge: 2019-06-15 | Disposition: A | Discharge status: Home / Self Care | Payer: BC Managed Care – PPO | Attending: Physician Assistant | Admitting: Physician Assistant

## 2019-06-15 DIAGNOSIS — M25552 Pain in left hip: Secondary | ICD-10-CM | POA: Diagnosis not present

## 2019-06-15 DIAGNOSIS — M25551 Pain in right hip: Secondary | ICD-10-CM | POA: Diagnosis not present

## 2019-06-15 HISTORY — DX: Bursopathy, unspecified: M71.9

## 2019-06-15 MED ORDER — DICLOFENAC SODIUM 1 % EX GEL: 4.0000 g | Freq: Four times a day (QID) | CUTANEOUS | 0 refills | Status: AC

## 2019-06-15 MED ORDER — TRAMADOL HCL 50 MG PO TABS: 50.0000 mg | ORAL_TABLET | Freq: Two times a day (BID) | ORAL | 0 refills | Status: DC | PRN

## 2019-06-15 NOTE — Discharge Instructions (Signed)
Continue celebrex as directed. Add voltaren gel and tylenol. If symptoms not improving, can take tramadol as needed. Rest, ice compress. Follow up with orthopedics as scheduled for further evaluation and management needed. If having redness, warmth, fever, follow up for reevaluation.

## 2019-06-15 NOTE — ED Provider Notes (Signed)
[name]    CSN: [id number] Arrival date & time: 06/15/19  1116      History   Chief Complaint Chief Complaint  Patient presents with  . Hip Pain    HPI Susan Torres is a 64 y.o. female.   64 year old female comes in for 4 day history of bilateral hip pain.  Denies injury/trauma.  States this has happened in the past, with bursitis to the hips.  Work requires long hours of standing and walking, and has had recent increase in hours.  Denies swelling, erythema, warmth.  Had had radiation down the leg at times.  Denies saddle anesthesia, loss of bladder or bowel control.  Celebrex at baseline, but was told to discontinue when taking Fosamax, and restarted Celebrex 2 days ago without improvement of symptoms.  Has orthopedic appointment next week.     Past Medical History:  Diagnosis Date  . Allergy   . Arthritis    all over in joints  . Bursitis   . Hyperlipidemia   . Hypertension   . Post-operative nausea and vomiting   . Uterine fibroid     Patient Active Problem List   Diagnosis Date Noted  . Preventative health care 04/30/2016  . Osteopenia 02/20/2013  . Hypoglycemia 11/06/2011  . HAND PAIN, BILATERAL 05/15/2009  . DIZZINESS 09/26/2008  . NAUSEA AND VOMITING 09/26/2008  . Hyperlipidemia 08/05/2007  . MYALGIA 08/05/2007  . Essential hypertension 03/17/2007  . ALLERGIC RHINITIS 03/17/2007    Past Surgical History:  Procedure Laterality Date  . BREAST CYST EXCISION     Rght Breast/benign  . EYE SURGERY  2005   Right Eye  . KNEE SURGERY  2002   Bilateral    OB History   No obstetric history on file.      Home Medications    Prior to Admission medications   Medication Sig Start Date End Date Taking? Authorizing Provider  alendronate (FOSAMAX) 70 MG tablet Take 1 tablet (70 mg total) by mouth every 7 (seven) days. Take with a full glass of water on an empty stomach. 10/18/18   Ann Held, DO  ALPRAZolam (XANAX) 0.25 MG  tablet Take 1 tablet (0.25 mg total) by mouth 2 (two) times daily as needed for anxiety. 09/26/18   Roma Schanz R, DO  amLODipine-benazepril (LOTREL) 5-10 MG capsule Take 1 capsule by mouth daily. NEED OV/FOLLOW UP 03/26/19   Ann Held, DO  aspirin EC 81 MG tablet Take 81 mg by mouth daily.    [provider]  b complex vitamins tablet Take 1 tablet by mouth daily.    [provider]  Calcium Carbonate-Vit D-Min (CALCIUM 1200 PO) Take by mouth daily.    [provider]  celecoxib (CELEBREX) 200 MG capsule TAKE ONE CAPSULE BY MOUTH TWICE A DAY 07/27/16   Carollee Herter, Alferd Apa, DO  cetirizine (ZYRTEC) 10 MG tablet Take 1 tablet (10 mg total) by mouth daily. 04/30/16   Ann Held, DO  Cholecalciferol (VITAMIN D3) 50 MCG (2000 UT) TABS Take by mouth. Take 3 daily    [provider]  diclofenac Sodium (VOLTAREN) 1 % GEL Apply 4 g topically 4 (four) times daily. 06/15/19   Tasia Catchings, Larenz Frasier V, PA-C  fluticasone (FLONASE) 50 MCG/ACT nasal spray Place 1 spray into both nostrils as needed for allergies or rhinitis.    [provider]  Nyoka Cowden Tea 150 MG CAPS Take by mouth every other day.  [provider]  ibandronate (BONIVA) 150 MG tablet TAKE 1 TABLET BY MOUTH EVERY 30 DAYS. 09/26/18   Carollee Herter, Alferd Apa, DO  Multiple Vitamin (MULTIVITAMIN) tablet Take 1 tablet by mouth daily.    [provider]  pravastatin (PRAVACHOL) 40 MG tablet TAKE 1 TABLET BY MOUTH EVERY DAY 06/02/19   Carollee Herter, Yvonne R, DO  PREMPRO 0.3-1.5 MG per tablet Take 1 tablet by mouth daily.  08/30/10   [provider]  traMADol (ULTRAM) 50 MG tablet Take 1 tablet (50 mg total) by mouth every 12 (twelve) hours as needed. 06/15/19   Ok Edwards, PA-C  Turmeric 450 MG CAPS Take by mouth daily.    [provider]    Family History Family History  Problem Relation Age of Onset  . Stomach cancer Father   . Alcohol abuse Father   .  Hypertension Mother   . Diabetes Mother   . Prostate cancer Brother   . Kidney failure Brother   . Alcoholism Brother   . Diabetes Brother     Social History Social History   Tobacco Use  . Smoking status: Passive Smoke Exposure - Never Smoker  . Smokeless tobacco: Never Used  Vaping Use  . Vaping Use: Never used  Substance Use Topics  . Alcohol use: Yes    Alcohol/week: 2.0 standard drinks    Types: 2 Glasses of wine per week  . Drug use: No     Allergies   Patient has no known allergies.   Review of Systems Review of Systems  Reason unable to perform ROS: See HPI as above.     Physical Exam Triage Vital Signs ED Triage Vitals  Enc Vitals Group     BP 06/15/19 1127 (!) 171/99     Pulse Rate 06/15/19 1127 71     Resp 06/15/19 1127 18     Temp 06/15/19 1127 98.3 F (36.8 C)     Temp Source 06/15/19 1127 Oral     SpO2 06/15/19 1127 96 %     Weight --      Height --      Head Circumference --      Peak Flow --      Pain Score 06/15/19 1128 9     Pain Loc --      Pain Edu? --      Excl. in [place]? --    No data found.  Updated Vital Signs BP (!) 171/99 (BP Location: Left Arm)   Pulse 71   Temp 98.3 F (36.8 C) (Oral)   Resp 18   SpO2 96%   Physical Exam Constitutional:      General: She is not in acute distress.    Appearance: Normal appearance. She is well-developed. She is not toxic-appearing or diaphoretic.  HENT:     Head: Normocephalic and atraumatic.  Eyes:     Conjunctiva/sclera: Conjunctivae normal.     Pupils: Pupils are equal, round, and reactive to light.  Pulmonary:     Effort: Pulmonary effort is normal. No respiratory distress.     Comments: Speaking in full sentences without difficulty Musculoskeletal:     Cervical back: Normal range of motion and neck supple.     Comments: No tenderness to palpation of the back.  Tenderness to palpation of lateral hips bilaterally with mild tenderness to anterior hip.  Decreased active range of  motion due to pain.  Full passive range of motion, with some pain during lateral  movements.  Strength deferred.  Sensation intact.  Negative straight leg raise.  Skin:    General: Skin is warm and dry.  Neurological:     Mental Status: She is alert and oriented to person, place, and time.      UC Treatments / Results  Labs (all labs ordered are listed, but only abnormal results are displayed) Labs Reviewed - No data to display  EKG   Radiology No results found.  Procedures Procedures (including critical care time)  Medications Ordered in UC Medications - No data to display  Initial Impression / Assessment and Plan / UC Course  I have reviewed the triage vital signs and the nursing notes.  Pertinent labs & imaging results that were available during my care of the patient were reviewed by me and considered in my medical decision making (see chart for details).    Patient to continue Celebrex as directed.  Due to history of osteopenia, and already has orthopedic appointment, will defer oral prednisone for now.  Patient to add Voltaren gel and Tylenol for symptomatic management.  If symptoms not improving, can add tramadol sparingly as needed.  Other symptomatic treatment discussed.  Return precautions given.  Otherwise patient to follow-up with orthopedics for further evaluation and management needed.  Final Clinical Impressions(s) / UC Diagnoses   Final diagnoses:  Bilateral hip pain   ED Prescriptions    Medication Sig Dispense Auth. Provider   diclofenac Sodium (VOLTAREN) 1 % GEL Apply 4 g topically 4 (four) times daily. 100 g Lyllian Gause V, PA-C   traMADol (ULTRAM) 50 MG tablet Take 1 tablet (50 mg total) by mouth every 12 (twelve) hours as needed. 15 tablet Ok Edwards, PA-C     I have reviewed the PDMP during this encounter.   Ok Edwards, PA-C 06/15/19 1517

## 2019-06-15 NOTE — ED Triage Notes (Signed)
Pt c/o bursitis pain to bilateral hips since Monday. States her orthopedic is unable to see her. Denies injury.

## 2019-06-30 ENCOUNTER — Other Ambulatory Visit: Payer: Self-pay | Admitting: Family Medicine

## 2019-06-30 DIAGNOSIS — I10 Essential (primary) hypertension: Secondary | ICD-10-CM

## 2019-07-26 ENCOUNTER — Other Ambulatory Visit: Payer: Self-pay | Admitting: Family Medicine

## 2019-07-26 DIAGNOSIS — I10 Essential (primary) hypertension: Secondary | ICD-10-CM

## 2019-08-19 ENCOUNTER — Other Ambulatory Visit: Payer: Self-pay | Admitting: Family Medicine

## 2019-08-19 DIAGNOSIS — I10 Essential (primary) hypertension: Secondary | ICD-10-CM

## 2019-08-28 ENCOUNTER — Ambulatory Visit
Admission: EM | Admit: 2019-08-28 | Discharge: 2019-08-28 | Disposition: A | Discharge status: Home / Self Care | Payer: BC Managed Care – PPO | Attending: Family Medicine | Admitting: Family Medicine

## 2019-08-28 ENCOUNTER — Encounter: Payer: Self-pay | Admitting: Emergency Medicine

## 2019-08-28 ENCOUNTER — Other Ambulatory Visit: Payer: Self-pay

## 2019-08-28 DIAGNOSIS — F411 Generalized anxiety disorder: Secondary | ICD-10-CM

## 2019-08-28 DIAGNOSIS — F419 Anxiety disorder, unspecified: Secondary | ICD-10-CM

## 2019-08-28 MED ORDER — ALPRAZOLAM 0.25 MG PO TABS: 0.2500 mg | ORAL_TABLET | Freq: Two times a day (BID) | ORAL | 1 refills | Status: DC | PRN

## 2019-08-28 NOTE — ED Triage Notes (Signed)
Pt here for increased anxiety; pt sts normally takes something but is now out of meds

## 2019-08-28 NOTE — ED Provider Notes (Signed)
[name]    CSN: [id number] Arrival date & time: 08/28/19  0910      History   Chief Complaint Chief Complaint  Patient presents with  . Anxiety    HPI Susan Torres is a 64 y.o. female.   Patient is a 64 year old female presents today with anxiety.  Past medical history of anxiety and was previously taking Xanax as needed.  Has not taken this in over a year.  Was taking it in the past due to family death.  For the past couple days she has had increased anxiety she believes is related to her hip pain.  This makes her anxious because she is unable to perform her job duties and is concerned about her job.  Denies any depression or suicidal ideations.  Denies any chest pain or shortness of breath.     Past Medical History:  Diagnosis Date  . Allergy   . Arthritis    all over in joints  . Bursitis   . Hyperlipidemia   . Hypertension   . Post-operative nausea and vomiting   . Uterine fibroid     Patient Active Problem List   Diagnosis Date Noted  . Preventative health care 04/30/2016  . Osteopenia 02/20/2013  . Hypoglycemia 11/06/2011  . HAND PAIN, BILATERAL 05/15/2009  . DIZZINESS 09/26/2008  . NAUSEA AND VOMITING 09/26/2008  . Hyperlipidemia 08/05/2007  . MYALGIA 08/05/2007  . Essential hypertension 03/17/2007  . ALLERGIC RHINITIS 03/17/2007    Past Surgical History:  Procedure Laterality Date  . BREAST CYST EXCISION     Rght Breast/benign  . EYE SURGERY  2005   Right Eye  . KNEE SURGERY  2002   Bilateral    OB History   No obstetric history on file.      Home Medications    Prior to Admission medications   Medication Sig Start Date End Date Taking? Authorizing Provider  alendronate (FOSAMAX) 70 MG tablet Take 1 tablet (70 mg total) by mouth every 7 (seven) days. Take with a full glass of water on an empty stomach. 10/18/18   Ann Held, DO  ALPRAZolam (XANAX) 0.25 MG tablet Take 1 tablet (0.25 mg total) by mouth 2 (two)  times daily as needed for anxiety. 08/28/19   Loura Halt A, NP  amLODipine-benazepril (LOTREL) 5-10 MG capsule TAKE 1 CAPSULE BY MOUTH EVERY DAY 08/21/19   Carollee Herter, Alferd Apa, DO  aspirin EC 81 MG tablet Take 81 mg by mouth daily.    [provider]  b complex vitamins tablet Take 1 tablet by mouth daily.    [provider]  Calcium Carbonate-Vit D-Min (CALCIUM 1200 PO) Take by mouth daily.    [provider]  celecoxib (CELEBREX) 200 MG capsule TAKE ONE CAPSULE BY MOUTH TWICE A DAY 07/27/16   Carollee Herter, Alferd Apa, DO  cetirizine (ZYRTEC) 10 MG tablet Take 1 tablet (10 mg total) by mouth daily. 04/30/16   Ann Held, DO  Cholecalciferol (VITAMIN D3) 50 MCG (2000 UT) TABS Take by mouth. Take 3 daily    [provider]  diclofenac Sodium (VOLTAREN) 1 % GEL Apply 4 g topically 4 (four) times daily. 06/15/19   Tasia Catchings, Amy V, PA-C  fluticasone (FLONASE) 50 MCG/ACT nasal spray Place 1 spray into both nostrils as needed for allergies or rhinitis.    [provider]  Nyoka Cowden Tea 150 MG CAPS Take by mouth every other day.    [provider]  ibandronate (BONIVA) 150 MG tablet TAKE 1 TABLET BY MOUTH EVERY 30 DAYS. 09/26/18   Carollee Herter, Alferd Apa, DO  Multiple Vitamin (MULTIVITAMIN) tablet Take 1 tablet by mouth daily.    [provider]  pravastatin (PRAVACHOL) 40 MG tablet TAKE 1 TABLET BY MOUTH EVERY DAY 06/02/19   Carollee Herter, Yvonne R, DO  PREMPRO 0.3-1.5 MG per tablet Take 1 tablet by mouth daily.  08/30/10   [provider]  traMADol (ULTRAM) 50 MG tablet Take 1 tablet (50 mg total) by mouth every 12 (twelve) hours as needed. 06/15/19   Ok Edwards, PA-C  Turmeric 450 MG CAPS Take by mouth daily.    [provider]    Family History Family History  Problem Relation Age of Onset  . Stomach cancer Father   . Alcohol abuse Father   . Hypertension Mother   . Diabetes Mother   . Prostate cancer Brother   . Kidney  failure Brother   . Alcoholism Brother   . Diabetes Brother     Social History Social History   Tobacco Use  . Smoking status: Passive Smoke Exposure - Never Smoker  . Smokeless tobacco: Never Used  Vaping Use  . Vaping Use: Never used  Substance Use Topics  . Alcohol use: Yes    Alcohol/week: 2.0 standard drinks    Types: 2 Glasses of wine per week  . Drug use: No     Allergies   Patient has no known allergies.   Review of Systems Review of Systems   Physical Exam Triage Vital Signs ED Triage Vitals [08/28/19 0944]  Enc Vitals Group     BP (!) 175/86     Pulse Rate 77     Resp 18     Temp 98 F (36.7 C)     Temp Source Oral     SpO2 98 %     Weight      Height      Head Circumference      Peak Flow      Pain Score 0     Pain Loc      Pain Edu?      Excl. in [place]?    No data found.  Updated Vital Signs BP (!) 175/86 (BP Location: Left Arm)   Pulse 77   Temp 98 F (36.7 C) (Oral)   Resp 18   SpO2 98%   Visual Acuity Right Eye Distance:   Left Eye Distance:   Bilateral Distance:    Right Eye Near:   Left Eye Near:    Bilateral Near:     Physical Exam Vitals and nursing note reviewed.  Constitutional:      General: She is not in acute distress.    Appearance: Normal appearance. She is not ill-appearing, toxic-appearing or diaphoretic.  HENT:     Head: Normocephalic.     Nose: Nose normal.  Eyes:     Conjunctiva/sclera: Conjunctivae normal.  Pulmonary:     Effort: Pulmonary effort is normal.  Musculoskeletal:        General: Normal range of motion.     Cervical back: Normal range of motion.  Skin:    General: Skin is warm and dry.     Findings: No rash.  Neurological:     General: No focal deficit present.     Mental Status: She is alert.  Psychiatric:        Mood and Affect: Mood normal.  Behavior: Behavior normal.        Thought Content: Thought content normal.        Judgment: Judgment normal.      UC Treatments /  Results  Labs (all labs ordered are listed, but only abnormal results are displayed) Labs Reviewed - No data to display  EKG   Radiology No results found.  Procedures Procedures (including critical care time)  Medications Ordered in UC Medications - No data to display  Initial Impression / Assessment and Plan / UC Course  I have reviewed the triage vital signs and the nursing notes.  Pertinent labs & imaging results that were available during my care of the patient were reviewed by me and considered in my medical decision making (see chart for details).     Anxiety We will refill her Xanax to use as needed. Patient has appointment to see her primary for yearly checkup in a month Recommended talking to her primary about possibly starting daily anxiety medication like SSRI to avoid abuse and overuse of benzos.  RecommendedFinal Clinical Impressions(s) / UC Diagnoses   Final diagnoses:  Anxiety state     Discharge Instructions     Refilled your medication to use as needed.  Please follow-up with your doctor as planned next month for follow-up    ED Prescriptions    Medication Sig Dispense Auth. Provider   ALPRAZolam (XANAX) 0.25 MG tablet Take 1 tablet (0.25 mg total) by mouth 2 (two) times daily as needed for anxiety. 20 tablet Loura Halt A, NP     PDMP not reviewed this encounter.   Orvan July, NP 08/28/19 1047

## 2019-08-28 NOTE — Discharge Instructions (Signed)
Refilled your medication to use as needed.  Please follow-up with your doctor as planned next month for follow-up

## 2019-08-31 ENCOUNTER — Other Ambulatory Visit: Payer: Self-pay | Admitting: Family Medicine

## 2019-08-31 DIAGNOSIS — E785 Hyperlipidemia, unspecified: Secondary | ICD-10-CM

## 2019-09-02 ENCOUNTER — Other Ambulatory Visit: Payer: Self-pay | Admitting: Family Medicine

## 2019-09-02 DIAGNOSIS — M8580 Other specified disorders of bone density and structure, unspecified site: Secondary | ICD-10-CM

## 2019-09-20 ENCOUNTER — Other Ambulatory Visit: Payer: Self-pay | Admitting: Family Medicine

## 2019-09-20 DIAGNOSIS — I10 Essential (primary) hypertension: Secondary | ICD-10-CM

## 2019-09-28 ENCOUNTER — Encounter: Payer: BC Managed Care – PPO | Admitting: Family Medicine

## 2019-10-20 ENCOUNTER — Other Ambulatory Visit: Payer: Self-pay | Admitting: Family Medicine

## 2019-10-20 DIAGNOSIS — I10 Essential (primary) hypertension: Secondary | ICD-10-CM

## 2019-10-26 ENCOUNTER — Encounter: Payer: Self-pay | Admitting: Family Medicine

## 2019-10-26 ENCOUNTER — Telehealth (INDEPENDENT_AMBULATORY_CARE_PROVIDER_SITE_OTHER): Payer: BC Managed Care – PPO | Admitting: Family Medicine

## 2019-10-26 ENCOUNTER — Other Ambulatory Visit: Payer: Self-pay

## 2019-10-26 DIAGNOSIS — R059 Cough, unspecified: Secondary | ICD-10-CM

## 2019-10-26 DIAGNOSIS — M25559 Pain in unspecified hip: Secondary | ICD-10-CM | POA: Diagnosis not present

## 2019-10-26 MED ORDER — IBANDRONATE SODIUM 150 MG PO TABS: 150.0000 mg | ORAL_TABLET | ORAL | 3 refills | Status: DC

## 2019-10-26 MED ORDER — AZITHROMYCIN 250 MG PO TABS: ORAL_TABLET | ORAL | 0 refills | Status: DC

## 2019-10-26 NOTE — Progress Notes (Signed)
Virtual Visit via Video Note  I connected with Susan Torres on 10/26/19 at  3:20 PM EDT by a video enabled telemedicine application and verified that I am speaking with the correct person using two identifiers. For the last 2 weeks she has had a productive cough , and congestion and runny nose --she took mucinex and tylenol-- it ihas almost resolved   Location/ persons involved  Patient: in her car alone Provider: office    I discussed the limitations of evaluation and management by telemedicine and the availability of in person appointments. The patient expressed understanding and agreed to proceed.  History of Present Illness: Pt c/o b/l hip pain-- she has seen ortho and they gave her exercises --  They gave her voltaren gel and she has had cortisone shots --  About 2 weeks after the shots she started waking up anxious    She needs f/u with ortho about her hips but the pain is making her anxious and she needs time out of work until she is able to f/u with ortho She also has had cough and congestion x 2 weeks --- antihistamine is not helping    Observations/Objective: There were no vitals filed for this visit. Pt is in nad   Assessment and Plan: 1. Cough con't allergy meds Get covid test Call or rto prn  - azithromycin (ZITHROMAX Z-PAK) 250 MG tablet; As directed  Dispense: 6 each; Refill: 0  2. Hip pain F/u ortho  Out of work until Nov 1 ---- f/u ortho if more timer needed  con't pain meds     Follow Up Instructions:    I discussed the assessment and treatment plan with the patient. The patient was provided an opportunity to ask questions and all were answered. The patient agreed with the plan and demonstrated an understanding of the instructions.   The patient was advised to call back or seek an in-person evaluation if the symptoms worsen or if the condition fails to improve as anticipated.  I provided 25 minutes of non-face-to-face time during this  encounter.   Ann Held, DO

## 2019-11-01 ENCOUNTER — Encounter: Payer: Self-pay | Admitting: Family Medicine

## 2019-11-07 ENCOUNTER — Other Ambulatory Visit: Payer: Self-pay | Admitting: Family Medicine

## 2019-11-07 NOTE — Telephone Encounter (Signed)
Form is done

## 2019-11-07 NOTE — Telephone Encounter (Signed)
Forms printed and given to [place]ne 

## 2019-11-16 ENCOUNTER — Encounter: Payer: Self-pay | Admitting: Family Medicine

## 2019-11-16 ENCOUNTER — Other Ambulatory Visit: Payer: Self-pay

## 2019-11-16 ENCOUNTER — Ambulatory Visit (INDEPENDENT_AMBULATORY_CARE_PROVIDER_SITE_OTHER): Payer: BC Managed Care – PPO | Admitting: Family Medicine

## 2019-11-16 VITALS — BP 130/80 | HR 83 | Temp 98.6°F | Resp 18 | Ht 60.0 in | Wt 155.6 lb

## 2019-11-16 DIAGNOSIS — Z0000 Encounter for general adult medical examination without abnormal findings: Secondary | ICD-10-CM

## 2019-11-16 DIAGNOSIS — E785 Hyperlipidemia, unspecified: Secondary | ICD-10-CM | POA: Diagnosis not present

## 2019-11-16 DIAGNOSIS — I10 Essential (primary) hypertension: Secondary | ICD-10-CM

## 2019-11-16 NOTE — Progress Notes (Signed)
Subjective:     Susan Torres is a 64 y.o. female and is here for a comprehensive physical exam. The patient reports no problems.  Social History   Socioeconomic History  . Marital status: Single    Spouse name: Not on file  . Number of children: Not on file  . Years of education: Not on file  . Highest education level: Not on file  Occupational History  . Occupation: [employer]  Tobacco Use  . Smoking status: Passive Smoke Exposure - Never Smoker  . Smokeless tobacco: Never Used  Vaping Use  . Vaping Use: Never used  Substance and Sexual Activity  . Alcohol use: Yes    Alcohol/week: 2.0 standard drinks    Types: 2 Glasses of wine per week  . Drug use: No  . Sexual activity: Not Currently    Partners: Male  Other Topics Concern  . Not on file  Social History Narrative  . Not on file   Social Determinants of Health   Financial Resource Strain:   . Difficulty of Paying Living Expenses: Not on file  Food Insecurity:   . Worried About Charity fundraiser in the Last Year: Not on file  . Ran Out of Food in the Last Year: Not on file  Transportation Needs:   . Lack of Transportation (Medical): Not on file  . Lack of Transportation (Non-Medical): Not on file  Physical Activity:   . Days of Exercise per Week: Not on file  . Minutes of Exercise per Session: Not on file  Stress:   . Feeling of Stress : Not on file  Social Connections:   . Frequency of Communication with Friends and Family: Not on file  . Frequency of Social Gatherings with Friends and Family: Not on file  . Attends Religious Services: Not on file  . Active Member of Clubs or Organizations: Not on file  . Attends Archivist Meetings: Not on file  . Marital Status: Not on file  Intimate Partner Violence:   . Fear of Current or Ex-Partner: Not on file  . Emotionally Abused: Not on file  . Physically Abused: Not on file  . Sexually Abused: Not on file   Health Maintenance  Topic Date Due  .  MAMMOGRAM  05/06/2016  . PAP SMEAR-Modifier  10/01/2019  . COVID-19 Vaccine (2 - Pfizer 2-dose series) 11/29/2019  . COLONOSCOPY  08/30/2028  . TETANUS/TDAP  09/25/2028  . INFLUENZA VACCINE  Completed  . Hepatitis C Screening  Completed  . HIV Screening  Completed    The following portions of the patient's history were reviewed and updated as appropriate:  She  has a past medical history of Allergy, Arthritis, Bursitis, Hyperlipidemia, Hypertension, Post-operative nausea and vomiting, and Uterine fibroid. She does not have any pertinent problems on file. She  has a past surgical history that includes Eye surgery (2005); Knee surgery (2002); and Breast cyst excision. Her family history includes Alcohol abuse in her father; Alcoholism in her brother; Diabetes in her brother and mother; Hypertension in her mother; Kidney failure in her brother; Prostate cancer in her brother; Stomach cancer in her father. She  reports that she is a non-smoker but has been exposed to tobacco smoke. She has never used smokeless tobacco. She reports current alcohol use of about 2.0 standard drinks of alcohol per week. She reports that she does not use drugs. She has a current medication list which includes the following prescription(s): alprazolam, amlodipine-benazepril, aspirin ec, b  complex vitamins, calcium carbonate-vit d-min, celecoxib, cetirizine, vitamin d3, diclofenac sodium, fluticasone, green tea, ibandronate, ibandronate, multivitamin, pravastatin, prempro, tramadol, and turmeric. Current Outpatient Medications on File Prior to Visit  Medication Sig Dispense Refill  . ALPRAZolam (XANAX) 0.25 MG tablet Take 1 tablet (0.25 mg total) by mouth 2 (two) times daily as needed for anxiety. 20 tablet 1  . amLODipine-benazepril (LOTREL) 5-10 MG capsule TAKE 1 CAPSULE BY MOUTH EVERY DAY 30 capsule 0  . aspirin EC 81 MG tablet Take 81 mg by mouth daily.    Marland Kitchen b complex vitamins tablet Take 1 tablet by mouth daily.     . Calcium Carbonate-Vit D-Min (CALCIUM 1200 PO) Take by mouth daily.    . celecoxib (CELEBREX) 200 MG capsule TAKE ONE CAPSULE BY MOUTH TWICE A DAY 180 capsule 2  . cetirizine (ZYRTEC) 10 MG tablet Take 1 tablet (10 mg total) by mouth daily. 90 tablet 2  . Cholecalciferol (VITAMIN D3) 50 MCG (2000 UT) TABS Take by mouth. Take 3 daily    . diclofenac Sodium (VOLTAREN) 1 % GEL Apply 4 g topically 4 (four) times daily. 100 g 0  . fluticasone (FLONASE) 50 MCG/ACT nasal spray Place 1 spray into both nostrils as needed for allergies or rhinitis.    Nyoka Cowden Tea 150 MG CAPS Take by mouth every other day.    . ibandronate (BONIVA) 150 MG tablet TAKE 1 TABLET BY MOUTH EVERY 30 DAYS. 3 tablet 3  . ibandronate (BONIVA) 150 MG tablet Take 1 tablet (150 mg total) by mouth every 30 (thirty) days. Take in the morning with a full glass of water, on an empty stomach, and do not take anything else by mouth or lie down for the next 30 min. 3 tablet 3  . Multiple Vitamin (MULTIVITAMIN) tablet Take 1 tablet by mouth daily.    . pravastatin (PRAVACHOL) 40 MG tablet TAKE 1 TABLET BY MOUTH EVERY DAY 90 tablet 0  . PREMPRO 0.3-1.5 MG per tablet Take 1 tablet by mouth daily.     . traMADol (ULTRAM) 50 MG tablet Take 1 tablet (50 mg total) by mouth every 12 (twelve) hours as needed. 15 tablet 0  . Turmeric 450 MG CAPS Take by mouth daily.     No current facility-administered medications on file prior to visit.   She has No Known Allergies..  Review of Systems Review of Systems  Constitutional: Negative for activity change, appetite change and fatigue.  HENT: Negative for hearing loss, congestion, tinnitus and ear discharge.  dentist q46m Eyes: Negative for visual disturbance (see optho q1y -- vision corrected to 20/20 with glasses).  Respiratory: Negative for cough, chest tightness and shortness of breath.   Cardiovascular: Negative for chest pain, palpitations and leg swelling.  Gastrointestinal: Negative for  abdominal pain, diarrhea, constipation and abdominal distention.  Genitourinary: Negative for urgency, frequency, decreased urine volume and difficulty urinating.  Musculoskeletal: Negative for back pain, arthralgias and gait problem.  Skin: Negative for color change, pallor and rash.  Neurological: Negative for dizziness, light-headedness, numbness and headaches.  Hematological: Negative for adenopathy. Does not bruise/bleed easily.  Psychiatric/Behavioral: Negative for suicidal ideas, confusion, sleep disturbance, self-injury, dysphoric mood, decreased concentration and agitation.       Objective:    BP 130/80 (BP Location: Right Arm, Patient Position: Sitting, Cuff Size: Normal)   Pulse 83   Temp 98.6 F (37 C) (Oral)   Resp 18   Ht 5' (1.524 m)   Wt 155 lb 9.6 oz (  70.6 kg)   SpO2 98%   BMI 30.39 kg/m  General appearance: alert, cooperative, appears stated age and no distress Head: Normocephalic, without obvious abnormality, atraumatic Eyes: conjunctivae/corneas clear. PERRL, EOM's intact. Fundi benign. Ears: normal TM's and external ear canals both ears Neck: no adenopathy, no carotid bruit, no JVD, supple, symmetrical, trachea midline and thyroid not enlarged, symmetric, no tenderness/mass/nodules Back: symmetric, no curvature. ROM normal. No CVA tenderness. Lungs: clear to auscultation bilaterally Breasts: gyn Heart: regular rate and rhythm, S1, S2 normal, no murmur, click, rub or gallop Abdomen: soft, non-tender; bowel sounds normal; no masses,  no organomegaly Pelvic: deferred--gyn Extremities: extremities normal, atraumatic, no cyanosis or edema Pulses: 2+ and symmetric Skin: Skin color, texture, turgor normal. No rashes or lesions Lymph nodes: Cervical, supraclavicular, and axillary nodes normal. Neurologic: Alert and oriented X 3, normal strength and tone. Normal symmetric reflexes. Normal coordination and gait    Assessment:    Healthy female exam.      Plan:     ghm utd Check labs  See After Visit Summary for Counseling Recommendations    1. Preventative health care ghm utd Check labs  - CBC with Differential/Platelet - Comprehensive metabolic panel - Lipid panel - TSH  2. Hyperlipidemia, unspecified hyperlipidemia type Encouraged heart healthy diet, increase exercise, avoid trans fats, consider a krill oil cap daily - Comprehensive metabolic panel - Lipid panel  3. Primary hypertension Well controlled, no changes to meds. Encouraged heart healthy diet such as the DASH diet and exercise as tolerated.  - CBC with Differential/Platelet - Comprehensive metabolic panel - Lipid panel - TSH

## 2019-11-16 NOTE — Patient Instructions (Signed)

## 2019-11-17 LAB — LIPID PANEL
Cholesterol: 140 mg/dL (ref <200)
HDL: 60 mg/dL (ref >=50)
LDL Cholesterol (Calc): 66 mg/dL (calc)
Non-HDL Cholesterol (Calc): 80 mg/dL (calc) (ref <130)
Total CHOL/HDL Ratio: 2.3 (calc) (ref <5.0)
Triglycerides: 65 mg/dL (ref <150)

## 2019-11-17 LAB — CBC WITH DIFFERENTIAL/PLATELET
Absolute Monocytes: 638 cells/uL (ref 200–950)
Basophils Absolute: 59 cells/uL (ref 0–200)
Basophils Relative: 0.7 %
Eosinophils Absolute: 386 cells/uL (ref 15–500)
Eosinophils Relative: 4.6 %
HCT: 46.1 % — ABNORMAL HIGH (ref 35.0–45.0)
Hemoglobin: 15.2 g/dL (ref 11.7–15.5)
Lymphs Abs: 1915 cells/uL (ref 850–3900)
MCH: 28.8 pg (ref 27.0–33.0)
MCHC: 33 g/dL (ref 32.0–36.0)
MCV: 87.3 fL (ref 80.0–100.0)
MPV: 10.2 fL (ref 7.5–12.5)
Monocytes Relative: 7.6 %
Neutro Abs: 5401 cells/uL (ref 1500–7800)
Neutrophils Relative %: 64.3 %
Platelets: 364 10*3/uL (ref 140–400)
RBC: 5.28 10*6/uL — ABNORMAL HIGH (ref 3.80–5.10)
RDW: 12.6 % (ref 11.0–15.0)
Total Lymphocyte: 22.8 %
WBC: 8.4 10*3/uL (ref 3.8–10.8)

## 2019-11-17 LAB — COMPREHENSIVE METABOLIC PANEL
AG Ratio: 2 (calc) (ref 1.0–2.5)
ALT: 17 U/L (ref 6–29)
AST: 21 U/L (ref 10–35)
Albumin: 4.5 g/dL (ref 3.6–5.1)
Alkaline phosphatase (APISO): 66 U/L (ref 37–153)
BUN: 11 mg/dL (ref 7–25)
CO2: 27 mmol/L (ref 20–32)
Calcium: 9.7 mg/dL (ref 8.6–10.4)
Chloride: 103 mmol/L (ref 98–110)
Creat: 0.73 mg/dL (ref 0.50–0.99)
Globulin: 2.3 g/dL (calc) (ref 1.9–3.7)
Glucose, Bld: 83 mg/dL (ref 65–99)
Potassium: 3.9 mmol/L (ref 3.5–5.3)
Sodium: 139 mmol/L (ref 135–146)
Total Bilirubin: 0.5 mg/dL (ref 0.2–1.2)
Total Protein: 6.8 g/dL (ref 6.1–8.1)

## 2019-11-17 LAB — TSH: TSH: 2.24 mIU/L (ref 0.40–4.50)

## 2019-11-18 ENCOUNTER — Other Ambulatory Visit: Payer: Self-pay | Admitting: Family Medicine

## 2019-11-18 DIAGNOSIS — I10 Essential (primary) hypertension: Secondary | ICD-10-CM

## 2019-11-22 DIAGNOSIS — Z01419 Encounter for gynecological examination (general) (routine) without abnormal findings: Secondary | ICD-10-CM | POA: Diagnosis not present

## 2019-11-22 DIAGNOSIS — Z130 Encounter for screening for diseases of the blood and blood-forming organs and certain disorders involving the immune mechanism: Secondary | ICD-10-CM | POA: Diagnosis not present

## 2019-11-22 DIAGNOSIS — Z6827 Body mass index (BMI) 27.0-27.9, adult: Secondary | ICD-10-CM | POA: Diagnosis not present

## 2019-11-22 DIAGNOSIS — Z1389 Encounter for screening for other disorder: Secondary | ICD-10-CM | POA: Diagnosis not present

## 2019-12-21 DIAGNOSIS — Z1231 Encounter for screening mammogram for malignant neoplasm of breast: Secondary | ICD-10-CM | POA: Diagnosis not present

## 2020-01-23 DIAGNOSIS — M7062 Trochanteric bursitis, left hip: Secondary | ICD-10-CM | POA: Diagnosis not present

## 2020-01-23 DIAGNOSIS — Z20822 Contact with and (suspected) exposure to covid-19: Secondary | ICD-10-CM | POA: Diagnosis not present

## 2020-01-23 DIAGNOSIS — M7061 Trochanteric bursitis, right hip: Secondary | ICD-10-CM | POA: Diagnosis not present

## 2020-02-13 ENCOUNTER — Other Ambulatory Visit: Payer: Self-pay | Admitting: Family Medicine

## 2020-02-13 DIAGNOSIS — I10 Essential (primary) hypertension: Secondary | ICD-10-CM

## 2020-02-15 ENCOUNTER — Other Ambulatory Visit: Payer: Self-pay | Admitting: Family Medicine

## 2020-02-15 DIAGNOSIS — E785 Hyperlipidemia, unspecified: Secondary | ICD-10-CM

## 2020-05-02 DIAGNOSIS — M25561 Pain in right knee: Secondary | ICD-10-CM | POA: Diagnosis not present

## 2020-05-02 DIAGNOSIS — M25551 Pain in right hip: Secondary | ICD-10-CM | POA: Diagnosis not present

## 2020-05-02 DIAGNOSIS — M7061 Trochanteric bursitis, right hip: Secondary | ICD-10-CM | POA: Diagnosis not present

## 2020-05-02 DIAGNOSIS — M25552 Pain in left hip: Secondary | ICD-10-CM | POA: Diagnosis not present

## 2020-05-02 DIAGNOSIS — M7062 Trochanteric bursitis, left hip: Secondary | ICD-10-CM | POA: Diagnosis not present

## 2020-05-12 ENCOUNTER — Other Ambulatory Visit: Payer: Self-pay | Admitting: Family Medicine

## 2020-05-12 DIAGNOSIS — I10 Essential (primary) hypertension: Secondary | ICD-10-CM

## 2020-05-13 DIAGNOSIS — M7062 Trochanteric bursitis, left hip: Secondary | ICD-10-CM | POA: Diagnosis not present

## 2020-05-13 DIAGNOSIS — M7061 Trochanteric bursitis, right hip: Secondary | ICD-10-CM | POA: Diagnosis not present

## 2020-05-15 DIAGNOSIS — M7061 Trochanteric bursitis, right hip: Secondary | ICD-10-CM | POA: Diagnosis not present

## 2020-05-15 DIAGNOSIS — M7062 Trochanteric bursitis, left hip: Secondary | ICD-10-CM | POA: Diagnosis not present

## 2020-05-21 DIAGNOSIS — M7061 Trochanteric bursitis, right hip: Secondary | ICD-10-CM | POA: Diagnosis not present

## 2020-05-21 DIAGNOSIS — M7062 Trochanteric bursitis, left hip: Secondary | ICD-10-CM | POA: Diagnosis not present

## 2020-05-22 DIAGNOSIS — M7062 Trochanteric bursitis, left hip: Secondary | ICD-10-CM | POA: Diagnosis not present

## 2020-05-22 DIAGNOSIS — M7061 Trochanteric bursitis, right hip: Secondary | ICD-10-CM | POA: Diagnosis not present

## 2020-05-30 DIAGNOSIS — M7061 Trochanteric bursitis, right hip: Secondary | ICD-10-CM | POA: Diagnosis not present

## 2020-05-30 DIAGNOSIS — M7062 Trochanteric bursitis, left hip: Secondary | ICD-10-CM | POA: Diagnosis not present

## 2020-06-04 DIAGNOSIS — M7062 Trochanteric bursitis, left hip: Secondary | ICD-10-CM | POA: Diagnosis not present

## 2020-06-04 DIAGNOSIS — M7061 Trochanteric bursitis, right hip: Secondary | ICD-10-CM | POA: Diagnosis not present

## 2020-06-20 DIAGNOSIS — M7061 Trochanteric bursitis, right hip: Secondary | ICD-10-CM | POA: Diagnosis not present

## 2020-06-20 DIAGNOSIS — M7062 Trochanteric bursitis, left hip: Secondary | ICD-10-CM | POA: Diagnosis not present

## 2020-06-24 DIAGNOSIS — M7061 Trochanteric bursitis, right hip: Secondary | ICD-10-CM | POA: Diagnosis not present

## 2020-06-24 DIAGNOSIS — M7062 Trochanteric bursitis, left hip: Secondary | ICD-10-CM | POA: Diagnosis not present

## 2020-06-28 DIAGNOSIS — M7062 Trochanteric bursitis, left hip: Secondary | ICD-10-CM | POA: Diagnosis not present

## 2020-06-28 DIAGNOSIS — M7061 Trochanteric bursitis, right hip: Secondary | ICD-10-CM | POA: Diagnosis not present

## 2020-07-03 DIAGNOSIS — M7061 Trochanteric bursitis, right hip: Secondary | ICD-10-CM | POA: Diagnosis not present

## 2020-07-03 DIAGNOSIS — M7062 Trochanteric bursitis, left hip: Secondary | ICD-10-CM | POA: Diagnosis not present

## 2020-07-05 DIAGNOSIS — M7062 Trochanteric bursitis, left hip: Secondary | ICD-10-CM | POA: Diagnosis not present

## 2020-07-05 DIAGNOSIS — M7061 Trochanteric bursitis, right hip: Secondary | ICD-10-CM | POA: Diagnosis not present

## 2020-07-14 DIAGNOSIS — Z20822 Contact with and (suspected) exposure to covid-19: Secondary | ICD-10-CM | POA: Diagnosis not present

## 2020-07-15 DIAGNOSIS — M7062 Trochanteric bursitis, left hip: Secondary | ICD-10-CM | POA: Diagnosis not present

## 2020-07-15 DIAGNOSIS — M7061 Trochanteric bursitis, right hip: Secondary | ICD-10-CM | POA: Diagnosis not present

## 2020-07-17 DIAGNOSIS — M7062 Trochanteric bursitis, left hip: Secondary | ICD-10-CM | POA: Diagnosis not present

## 2020-07-17 DIAGNOSIS — M7061 Trochanteric bursitis, right hip: Secondary | ICD-10-CM | POA: Diagnosis not present

## 2020-07-18 DIAGNOSIS — Z20822 Contact with and (suspected) exposure to covid-19: Secondary | ICD-10-CM | POA: Diagnosis not present

## 2020-08-03 DIAGNOSIS — Z20822 Contact with and (suspected) exposure to covid-19: Secondary | ICD-10-CM | POA: Diagnosis not present

## 2020-08-06 DIAGNOSIS — M7062 Trochanteric bursitis, left hip: Secondary | ICD-10-CM | POA: Diagnosis not present

## 2020-08-06 DIAGNOSIS — M7061 Trochanteric bursitis, right hip: Secondary | ICD-10-CM | POA: Diagnosis not present

## 2020-08-20 ENCOUNTER — Other Ambulatory Visit: Payer: Self-pay | Admitting: Family Medicine

## 2020-08-20 DIAGNOSIS — I10 Essential (primary) hypertension: Secondary | ICD-10-CM

## 2020-08-22 DIAGNOSIS — M7061 Trochanteric bursitis, right hip: Secondary | ICD-10-CM | POA: Diagnosis not present

## 2020-08-22 DIAGNOSIS — M7062 Trochanteric bursitis, left hip: Secondary | ICD-10-CM | POA: Diagnosis not present

## 2020-08-28 DIAGNOSIS — M7062 Trochanteric bursitis, left hip: Secondary | ICD-10-CM | POA: Diagnosis not present

## 2020-08-28 DIAGNOSIS — M7061 Trochanteric bursitis, right hip: Secondary | ICD-10-CM | POA: Diagnosis not present

## 2020-09-02 ENCOUNTER — Other Ambulatory Visit: Payer: Self-pay | Admitting: Family Medicine

## 2020-09-02 DIAGNOSIS — M7062 Trochanteric bursitis, left hip: Secondary | ICD-10-CM | POA: Diagnosis not present

## 2020-09-02 DIAGNOSIS — E785 Hyperlipidemia, unspecified: Secondary | ICD-10-CM

## 2020-09-02 DIAGNOSIS — M7061 Trochanteric bursitis, right hip: Secondary | ICD-10-CM | POA: Diagnosis not present

## 2020-10-01 ENCOUNTER — Ambulatory Visit (INDEPENDENT_AMBULATORY_CARE_PROVIDER_SITE_OTHER): Payer: BC Managed Care – PPO | Admitting: Family Medicine

## 2020-10-01 ENCOUNTER — Other Ambulatory Visit: Payer: Self-pay

## 2020-10-01 ENCOUNTER — Encounter: Payer: Self-pay | Admitting: Family Medicine

## 2020-10-01 VITALS — BP 136/88 | HR 87 | Temp 98.4°F | Resp 18 | Ht 60.0 in | Wt 156.4 lb

## 2020-10-01 DIAGNOSIS — Z23 Encounter for immunization: Secondary | ICD-10-CM

## 2020-10-01 DIAGNOSIS — F419 Anxiety disorder, unspecified: Secondary | ICD-10-CM | POA: Insufficient documentation

## 2020-10-01 MED ORDER — ESCITALOPRAM OXALATE 10 MG PO TABS: 10.0000 mg | ORAL_TABLET | Freq: Every day | ORAL | 2 refills | Status: DC

## 2020-10-01 NOTE — Assessment & Plan Note (Signed)
con't prn xanax Add lexapro 10 mg qd Note written for 2 weeks out of work Pt will make app for counselor  F/u 1 month

## 2020-10-01 NOTE — Patient Instructions (Signed)
Generalized Anxiety Disorder, Adult Generalized anxiety disorder (GAD) is a mental health condition. Unlike normal worries, anxiety related to GAD is not triggered by a specific event. These worries do not fade or get better with time. GAD interferes with relationships, work, and school. GAD symptoms can vary from mild to severe. People with severe GAD can have intense waves of anxiety with physical symptoms that are similar to panic attacks. What are the causes? The exact cause of GAD is not known, but the following are believed to have an impact: Differences in natural brain chemicals. Genes passed down from parents to children. Differences in the way threats are perceived. Development during childhood. Personality. What increases the risk? The following factors may make you more likely to develop this condition: Being female. Having a family history of anxiety disorders. Being very shy. Experiencing very stressful life events, such as the death of a loved one. Having a very stressful family environment. What are the signs or symptoms? People with GAD often worry excessively about many things in their lives, such as their health and family. Symptoms may also include: Mental and emotional symptoms: Worrying excessively about natural disasters. Fear of being late. Difficulty concentrating. Fears that others are judging your performance. Physical symptoms: Fatigue. Headaches, muscle tension, muscle twitches, trembling, or feeling shaky. Feeling like your heart is pounding or beating very fast. Feeling out of breath or like you cannot take a deep breath. Having trouble falling asleep or staying asleep, or experiencing restlessness. Sweating. Nausea, diarrhea, or irritable bowel syndrome (IBS). Behavioral symptoms: Experiencing erratic moods or irritability. Avoidance of new situations. Avoidance of people. Extreme difficulty making decisions. How is this diagnosed? This condition  is diagnosed based on your symptoms and medical history. You will also have a physical exam. Your health care provider may perform tests to rule out other possible causes of your symptoms. To be diagnosed with GAD, a person must have anxiety that: Is out of his or her control. Affects several different aspects of his or her life, such as work and relationships. Causes distress that makes him or her unable to take part in normal activities. Includes at least three symptoms of GAD, such as restlessness, fatigue, trouble concentrating, irritability, muscle tension, or sleep problems. Before your health care provider can confirm a diagnosis of GAD, these symptoms must be present more days than they are not, and they must last for 6 months or longer. How is this treated? This condition may be treated with: Medicine. Antidepressant medicine is usually prescribed for long-term daily control. Anti-anxiety medicines may be added in severe cases, especially when panic attacks occur. Talk therapy (psychotherapy). Certain types of talk therapy can be helpful in treating GAD by providing support, education, and guidance. Options include: Cognitive behavioral therapy (CBT). People learn coping skills and self-calming techniques to ease their physical symptoms. They learn to identify unrealistic thoughts and behaviors and to replace them with more appropriate thoughts and behaviors. Acceptance and commitment therapy (ACT). This treatment teaches people how to be mindful as a way to cope with unwanted thoughts and feelings. Biofeedback. This process trains you to manage your body's response (physiological response) through breathing techniques and relaxation methods. You will work with a therapist while machines are used to monitor your physical symptoms. Stress management techniques. These include yoga, meditation, and exercise. A mental health specialist can help determine which treatment is best for you. Some  people see improvement with one type of therapy. However, other people require a combination   of therapies. Follow these instructions at home: Lifestyle Maintain a consistent routine and schedule. Anticipate stressful situations. Create a plan, and allow extra time to work with your plan. Practice stress management or self-calming techniques that you have learned from your therapist or your health care provider. General instructions Take over-the-counter and prescription medicines only as told by your health care provider. Understand that you are likely to have setbacks. Accept this and be kind to yourself as you persist to take better care of yourself. Recognize and accept your accomplishments, even if you judge them as small. Keep all follow-up visits as told by your health care provider. This is important. Contact a health care provider if: Your symptoms do not get better. Your symptoms get worse. You have signs of depression, such as: A persistently sad or irritable mood. Loss of enjoyment in activities that used to bring you joy. Change in weight or eating. Changes in sleeping habits. Avoiding friends or family members. Loss of energy for normal tasks. Feelings of guilt or worthlessness. Get help right away if: You have serious thoughts about hurting yourself or others. If you ever feel like you may hurt yourself or others, or have thoughts about taking your own life, get help right away. Go to your nearest emergency department or: Call your local emergency services (911 in the [place] Call a suicide crisis helpline, such as the National Suicide Prevention Lifeline at 1-800-273-8255. This is open 24 hours a day in the [place] Text the Crisis Text Line at 741741 (in the [place] Summary Generalized anxiety disorder (GAD) is a mental health condition that involves worry that is not triggered by a specific event. People with GAD often worry excessively about many things in their lives, such  as their health and family. GAD may cause symptoms such as restlessness, trouble concentrating, sleep problems, frequent sweating, nausea, diarrhea, headaches, and trembling or muscle twitching. A mental health specialist can help determine which treatment is best for you. Some people see improvement with one type of therapy. However, other people require a combination of therapies. This information is not intended to replace advice given to you by your health care provider. Make sure you discuss any questions you have with your health care provider. Document Revised: 10/12/2018 Document Reviewed: 10/12/2018 Elsevier Patient Education  2022 Elsevier Inc.  

## 2020-10-01 NOTE — Progress Notes (Signed)
Established Patient Office Visit  Subjective:  Patient ID: Susan Torres, female    DOB: [birth year]  Age: 65 y.o. MRN: [id number]  CC:  Chief Complaint  Patient presents with   Anxiety    Pt states xanax is not helping but states she only takes when needed.    Follow-up    HPI Susan Torres presents for increased anxiety --- pt is seeing ortho for her hip pain and she is not sleeping due to pain and increased anxiety.   She has trouble at work esp because she is on her feet all day and the pain is severe    she has a f/u with ortho soon.    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   Bursitis    Hyperlipidemia    Hypertension    Post-operative nausea and vomiting    Uterine fibroid     Past Surgical History:  Procedure Laterality Date   BREAST CYST EXCISION     Rght Breast/benign   EYE SURGERY  2005   Right Eye   KNEE SURGERY  2002   Bilateral    Family History  Problem Relation Age of Onset   Stomach cancer Father    Alcohol abuse Father    Hypertension Mother    Diabetes Mother    Prostate cancer Brother    Kidney failure Brother    Alcoholism Brother    Diabetes Brother     Social History   Socioeconomic History   Marital status: Single    Spouse name: Not on file   Number of children: Not on file   Years of education: Not on file   Highest education level: Not on file  Occupational History   Occupation: [employer]  Tobacco Use   Smoking status: Never    Passive exposure: Yes   Smokeless tobacco: Never  Vaping Use   Vaping Use: Never used  Substance and Sexual Activity   Alcohol use: Yes    Alcohol/week: 2.0 standard drinks    Types: 2 Glasses of wine per week   Drug use: No   Sexual activity: Not Currently    Partners: Male  Other Topics Concern   Not on file  Social History Narrative   Not on file   Social Determinants of Health   Financial Resource Strain: Not on file  Food Insecurity:  Not on file  Transportation Needs: Not on file  Physical Activity: Not on file  Stress: Not on file  Social Connections: Not on file  Intimate Partner Violence: Not on file    Outpatient Medications Prior to Visit  Medication Sig Dispense Refill   ALPRAZolam (XANAX) 0.25 MG tablet Take 1 tablet (0.25 mg total) by mouth 2 (two) times daily as needed for anxiety. 20 tablet 1   amLODipine-benazepril (LOTREL) 5-10 MG capsule TAKE 1 CAPSULE BY MOUTH EVERY DAY 90 capsule 0   aspirin EC 81 MG tablet Take 81 mg by mouth daily.     b complex vitamins tablet Take 1 tablet by mouth daily.     Calcium Carbonate-Vit D-Min (CALCIUM 1200 PO) Take by mouth daily.     celecoxib (CELEBREX) 200 MG capsule TAKE ONE CAPSULE BY MOUTH TWICE A DAY 180 capsule 2   cetirizine (ZYRTEC) 10 MG tablet Take 1 tablet (10 mg total) by mouth daily. 90 tablet 2   Cholecalciferol (VITAMIN D3) 50 MCG (2000 UT) TABS  Take by mouth. Take 3 daily     diclofenac Sodium (VOLTAREN) 1 % GEL Apply 4 g topically 4 (four) times daily. 100 g 0   fluticasone (FLONASE) 50 MCG/ACT nasal spray Place 1 spray into both nostrils as needed for allergies or rhinitis.     Green Tea 150 MG CAPS Take by mouth every other day.     ibandronate (BONIVA) 150 MG tablet TAKE 1 TABLET BY MOUTH EVERY 30 DAYS. 3 tablet 3   ibandronate (BONIVA) 150 MG tablet Take 1 tablet (150 mg total) by mouth every 30 (thirty) days. Take in the morning with a full glass of water, on an empty stomach, and do not take anything else by mouth or lie down for the next 30 min. 3 tablet 3   Multiple Vitamin (MULTIVITAMIN) tablet Take 1 tablet by mouth daily.     pravastatin (PRAVACHOL) 40 MG tablet TAKE 1 TABLET BY MOUTH EVERY DAY 90 tablet 0   PREMPRO 0.3-1.5 MG per tablet Take 1 tablet by mouth daily.      traMADol (ULTRAM) 50 MG tablet Take 1 tablet (50 mg total) by mouth every 12 (twelve) hours as needed. 15 tablet 0   Turmeric 450 MG CAPS Take by mouth daily.     No  facility-administered medications prior to visit.    No Known Allergies  ROS Review of Systems  Constitutional:  Negative for chills, fever and malaise/fatigue.  HENT:  Negative for congestion and hearing loss.   Eyes:  Negative for blurred vision and discharge.  Respiratory:  Negative for cough, sputum production and shortness of breath.   Cardiovascular:  Negative for chest pain, palpitations and leg swelling.  Gastrointestinal:  Negative for abdominal pain, blood in stool, constipation, diarrhea, heartburn, nausea and vomiting.  Genitourinary:  Negative for dysuria, frequency, hematuria and urgency.  Musculoskeletal:  Negative for back pain, falls and myalgias.  Skin:  Negative for rash.  Neurological:  Negative for dizziness, sensory change, loss of consciousness, weakness and headaches.  Endo/Heme/Allergies:  Negative for environmental allergies. Does not bruise/bleed easily.  Psychiatric/Behavioral:  Negative for depression and suicidal ideas. The patient is nervous/anxious and has insomnia.       Objective:    Physical Exam Vitals and nursing note reviewed.  Constitutional:      Appearance: She is well-developed.  HENT:     Head: Normocephalic and atraumatic.  Eyes:     Conjunctiva/sclera: Conjunctivae normal.  Neck:     Thyroid: No thyromegaly.     Vascular: No carotid bruit or JVD.  Cardiovascular:     Rate and Rhythm: Normal rate and regular rhythm.     Heart sounds: Normal heart sounds. No murmur heard. Pulmonary:     Effort: Pulmonary effort is normal. No respiratory distress.     Breath sounds: Normal breath sounds. No wheezing or rales.  Chest:     Chest wall: No tenderness.  Musculoskeletal:     Cervical back: Normal range of motion and neck supple.  Neurological:     Mental Status: She is alert and oriented to person, place, and time.  Psychiatric:        Attention and Perception: She is attentive.        Mood and Affect: Mood is anxious and depressed.  Mood is not elated. Affect is flat. Affect is not labile, blunt, angry or inappropriate.        Speech: Speech normal.        Behavior: Behavior normal.  Thought Content: Thought content is not paranoid. Thought content does not include homicidal or suicidal ideation. Thought content does not include homicidal or suicidal plan.    BP 136/88 (BP Location: Right Arm, Patient Position: Sitting, Cuff Size: Normal)   Pulse 87   Temp 98.4 F (36.9 C) (Oral)   Resp 18   Ht 5' (1.524 m)   Wt 156 lb 6.4 oz (70.9 kg)   SpO2 97%   BMI 30.54 kg/m  Wt Readings from Last 3 Encounters:  10/01/20 156 lb 6.4 oz (70.9 kg)  11/16/19 155 lb 9.6 oz (70.6 kg)  09/26/18 151 lb 6.4 oz (68.7 kg)     Health Maintenance Due  Topic Date Due   MAMMOGRAM  05/06/2016   PAP SMEAR-Modifier  10/01/2019    There are no preventive care reminders to display for this patient.  Lab Results  Component Value Date   TSH 2.24 11/16/2019   Lab Results  Component Value Date   WBC 8.4 11/16/2019   HGB 15.2 11/16/2019   HCT 46.1 (H) 11/16/2019   MCV 87.3 11/16/2019   PLT 364 11/16/2019   Lab Results  Component Value Date   NA 139 11/16/2019   K 3.9 11/16/2019   CO2 27 11/16/2019   GLUCOSE 83 11/16/2019   BUN 11 11/16/2019   CREATININE 0.73 11/16/2019   BILITOT 0.5 11/16/2019   ALKPHOS 63 04/30/2016   AST 21 11/16/2019   ALT 17 11/16/2019   PROT 6.8 11/16/2019   ALBUMIN 4.3 04/30/2016   CALCIUM 9.7 11/16/2019   ANIONGAP 7 05/24/2017   GFR 109.08 05/14/2014   Lab Results  Component Value Date   CHOL 140 11/16/2019   Lab Results  Component Value Date   HDL 60 11/16/2019   Lab Results  Component Value Date   LDLCALC 66 11/16/2019   Lab Results  Component Value Date   TRIG 65 11/16/2019   Lab Results  Component Value Date   CHOLHDL 2.3 11/16/2019   Lab Results  Component Value Date   HGBA1C 5.8 (H) 07/30/2017      Assessment & Plan:   Problem List Items Addressed This Visit        Unprioritized   Anxiety - Primary    con't prn xanax Add lexapro 10 mg qd Note written for 2 weeks out of work Pt will make app for counselor  F/u 1 month       Relevant Medications   escitalopram (LEXAPRO) 10 MG tablet   Other Visit Diagnoses     Need for influenza vaccination       Relevant Orders   Flu Vaccine QUAD 32mo+IM (Fluarix, Fluzone & Alfiuria Quad PF) (Completed)       Meds ordered this encounter  Medications   escitalopram (LEXAPRO) 10 MG tablet    Sig: Take 1 tablet (10 mg total) by mouth daily.    Dispense:  30 tablet    Refill:  2    Follow-up: Return in about 4 weeks (around 10/29/2020) for anxiety.    Ann Held, DO

## 2020-10-02 ENCOUNTER — Encounter: Payer: Self-pay | Admitting: Family Medicine

## 2020-10-08 ENCOUNTER — Other Ambulatory Visit: Payer: Self-pay | Admitting: Family Medicine

## 2020-10-08 DIAGNOSIS — M8580 Other specified disorders of bone density and structure, unspecified site: Secondary | ICD-10-CM

## 2020-10-09 ENCOUNTER — Encounter: Payer: Self-pay | Admitting: Family Medicine

## 2020-10-09 NOTE — Telephone Encounter (Signed)
Pt just seen on 10/01/20. Would you like a virtual to fill out short term paperwork?

## 2020-10-11 ENCOUNTER — Encounter: Payer: Self-pay | Admitting: Family Medicine

## 2020-10-14 NOTE — Telephone Encounter (Signed)
New letter sent to [facility]

## 2020-10-14 NOTE — Telephone Encounter (Signed)
Pt called stating her RTW letter needs to state she can return with no restrictions.

## 2020-10-23 ENCOUNTER — Other Ambulatory Visit: Payer: Self-pay | Admitting: Family Medicine

## 2020-10-23 DIAGNOSIS — F419 Anxiety disorder, unspecified: Secondary | ICD-10-CM

## 2020-10-29 ENCOUNTER — Other Ambulatory Visit: Payer: Self-pay

## 2020-10-29 ENCOUNTER — Telehealth (INDEPENDENT_AMBULATORY_CARE_PROVIDER_SITE_OTHER): Payer: BC Managed Care – PPO | Admitting: Family Medicine

## 2020-10-29 ENCOUNTER — Encounter: Payer: Self-pay | Admitting: Family Medicine

## 2020-10-29 DIAGNOSIS — F419 Anxiety disorder, unspecified: Secondary | ICD-10-CM | POA: Diagnosis not present

## 2020-10-29 MED ORDER — ESCITALOPRAM OXALATE 10 MG PO TABS: 10.0000 mg | ORAL_TABLET | Freq: Every day | ORAL | 3 refills | Status: DC

## 2020-10-29 NOTE — Progress Notes (Signed)
Virtual telephone visit    Virtual Visit via Telephone Note   This visit type was conducted due to national recommendations for restrictions regarding the COVID-19 Pandemic (e.g. social distancing) in an effort to limit this patient's exposure and mitigate transmission in our community. Due to her co-morbid illnesses, this patient is at least at moderate risk for complications without adequate follow up. This format is felt to be most appropriate for this patient at this time. The patient did not have access to video technology or had technical difficulties with video requiring transitioning to audio format only (telephone). Physical exam was limited to content and character of the telephone converstion. Susan Torres was able to get the patient set up on a telephone visit.  Video connection was lost at <50% of the duration of the visit, at which time the remainder of the visit was completed via audio only.  Patient location: home alone Patient and provider in visit Provider location: Office  I discussed the limitations of evaluation and management by telemedicine and the availability of in person appointments. The patient expressed understanding and agreed to proceed.   Visit Date: 10/29/2020  Today's healthcare provider: Ann Held, DO     Subjective:    Patient ID: Susan Torres, female    DOB: 12-29-1955, 65 y.o.   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   Bursitis    Hyperlipidemia    Hypertension    Post-operative nausea and vomiting    Uterine fibroid     Past Surgical History:  Procedure Laterality Date   BREAST CYST EXCISION     Rght Breast/benign   EYE SURGERY  2005   Right Eye   KNEE SURGERY  2002    Bilateral    Family History  Problem Relation Age of Onset   Stomach cancer Father    Alcohol abuse Father    Hypertension Mother    Diabetes Mother    Prostate cancer Brother    Kidney failure Brother    Alcoholism Brother    Diabetes Brother     Social History   Socioeconomic History   Marital status: Single    Spouse name: Not on file   Number of children: Not on file   Years of education: Not on file   Highest education level: Not on file  Occupational History   Occupation: [employer]  Tobacco Use   Smoking status: Never    Passive exposure: Yes   Smokeless tobacco: Never  Vaping Use   Vaping Use: Never used  Substance and Sexual Activity   Alcohol use: Yes    Alcohol/week: 2.0 standard drinks    Types: 2 Glasses of wine per week   Drug use: No   Sexual activity: Not Currently    Partners: Male  Other Topics Concern   Not on file  Social History Narrative   Not on file   Social Determinants of Health   Financial Resource Strain: Not on file  Food Insecurity: Not on file  Transportation Needs: Not on file  Physical Activity: Not on file  Stress: Not on file  Social Connections: Not on file  Intimate Partner  Violence: Not on file    Outpatient Medications Prior to Visit  Medication Sig Dispense Refill   ALPRAZolam (XANAX) 0.25 MG tablet Take 1 tablet (0.25 mg total) by mouth 2 (two) times daily as needed for anxiety. 20 tablet 1   amLODipine-benazepril (LOTREL) 5-10 MG capsule TAKE 1 CAPSULE BY MOUTH EVERY DAY 90 capsule 0   aspirin EC 81 MG tablet Take 81 mg by mouth daily.     b complex vitamins tablet Take 1 tablet by mouth daily.     Calcium Carbonate-Vit D-Min (CALCIUM 1200 PO) Take by mouth daily.     celecoxib (CELEBREX) 200 MG capsule TAKE ONE CAPSULE BY MOUTH TWICE A DAY 180 capsule 2   cetirizine (ZYRTEC) 10 MG tablet Take 1 tablet (10 mg total) by mouth daily. 90 tablet 2   Cholecalciferol (VITAMIN D3) 50 MCG (2000 UT) TABS Take by mouth. Take  3 daily     diclofenac Sodium (VOLTAREN) 1 % GEL Apply 4 g topically 4 (four) times daily. 100 g 0   fluticasone (FLONASE) 50 MCG/ACT nasal spray Place 1 spray into both nostrils as needed for allergies or rhinitis.     Green Tea 150 MG CAPS Take by mouth every other day.     ibandronate (BONIVA) 150 MG tablet TAKE 1 TABLET BY MOUTH EVERY 30 DAYS. 3 tablet 3   Multiple Vitamin (MULTIVITAMIN) tablet Take 1 tablet by mouth daily.     pravastatin (PRAVACHOL) 40 MG tablet TAKE 1 TABLET BY MOUTH EVERY DAY 90 tablet 0   PREMPRO 0.3-1.5 MG per tablet Take 1 tablet by mouth daily.      traMADol (ULTRAM) 50 MG tablet Take 1 tablet (50 mg total) by mouth every 12 (twelve) hours as needed. 15 tablet 0   Turmeric 450 MG CAPS Take by mouth daily.     escitalopram (LEXAPRO) 10 MG tablet Take 1 tablet (10 mg total) by mouth daily. 30 tablet 2   No facility-administered medications prior to visit.    No Known Allergies  Review of Systems  Constitutional:  Negative for fever and malaise/fatigue.  HENT:  Negative for congestion.   Eyes:  Negative for blurred vision.  Respiratory:  Negative for cough and shortness of breath.   Cardiovascular:  Negative for chest pain, palpitations and leg swelling.  Gastrointestinal:  Negative for vomiting.  Musculoskeletal:  Negative for back pain.  Skin:  Negative for rash.  Neurological:  Negative for loss of consciousness and headaches.  Psychiatric/Behavioral:  Negative for depression, memory loss and suicidal ideas. The patient is nervous/anxious. The patient does not have insomnia.       Objective:    Physical Exam Vitals and nursing note reviewed.  Psychiatric:        Attention and Perception: Attention and perception normal.        Mood and Affect: Mood is anxious. Mood is not depressed.        Thought Content: Thought content normal. Thought content does not include homicidal or suicidal ideation. Thought content does not include homicidal or suicidal  plan.        Cognition and Memory: Cognition and memory normal.        Judgment: Judgment normal.   There were no vitals taken for this visit. Wt Readings from Last 3 Encounters:  10/01/20 156 lb 6.4 oz (70.9 kg)  11/16/19 155 lb 9.6 oz (70.6 kg)  09/26/18 151 lb 6.4 oz (68.7 kg)    Diabetic Foot Exam - Simple  No data filed    Lab Results  Component Value Date   WBC 8.4 11/16/2019   HGB 15.2 11/16/2019   HCT 46.1 (H) 11/16/2019   PLT 364 11/16/2019   GLUCOSE 83 11/16/2019   CHOL 140 11/16/2019   TRIG 65 11/16/2019   HDL 60 11/16/2019   LDLCALC 66 11/16/2019   ALT 17 11/16/2019   AST 21 11/16/2019   NA 139 11/16/2019   K 3.9 11/16/2019   CL 103 11/16/2019   CREATININE 0.73 11/16/2019   BUN 11 11/16/2019   CO2 27 11/16/2019   TSH 2.24 11/16/2019   HGBA1C 5.8 (H) 07/30/2017   MICROALBUR <0.7 05/14/2014    Lab Results  Component Value Date   TSH 2.24 11/16/2019   Lab Results  Component Value Date   WBC 8.4 11/16/2019   HGB 15.2 11/16/2019   HCT 46.1 (H) 11/16/2019   MCV 87.3 11/16/2019   PLT 364 11/16/2019   Lab Results  Component Value Date   NA 139 11/16/2019   K 3.9 11/16/2019   CO2 27 11/16/2019   GLUCOSE 83 11/16/2019   BUN 11 11/16/2019   CREATININE 0.73 11/16/2019   BILITOT 0.5 11/16/2019   ALKPHOS 63 04/30/2016   AST 21 11/16/2019   ALT 17 11/16/2019   PROT 6.8 11/16/2019   ALBUMIN 4.3 04/30/2016   CALCIUM 9.7 11/16/2019   ANIONGAP 7 05/24/2017   GFR 109.08 05/14/2014   Lab Results  Component Value Date   CHOL 140 11/16/2019   Lab Results  Component Value Date   HDL 60 11/16/2019   Lab Results  Component Value Date   LDLCALC 66 11/16/2019   Lab Results  Component Value Date   TRIG 65 11/16/2019   Lab Results  Component Value Date   CHOLHDL 2.3 11/16/2019   Lab Results  Component Value Date   HGBA1C 5.8 (H) 07/30/2017       Assessment & Plan:   Problem List Items Addressed This Visit       Unprioritized    Anxiety   Relevant Medications   escitalopram (LEXAPRO) 10 MG tablet    I am having Susan Torres maintain her Prempro, multivitamin, cetirizine, celecoxib, aspirin EC, fluticasone, Vitamin D3, b complex vitamins, Green Tea, Calcium Carbonate-Vit D-Min (CALCIUM 1200 PO), Turmeric, diclofenac Sodium, traMADol, ALPRAZolam, amLODipine-benazepril, pravastatin, ibandronate, and escitalopram.  Meds ordered this encounter  Medications   escitalopram (LEXAPRO) 10 MG tablet    Sig: Take 1 tablet (10 mg total) by mouth daily.    Dispense:  90 tablet    Refill:  3     I discussed the assessment and treatment plan with the patient. The patient was provided an opportunity to ask questions and all were answered. The patient agreed with the plan and demonstrated an understanding of the instructions.   The patient was advised to call back or seek an in-person evaluation if the symptoms worsen or if the condition fails to improve as anticipated.  I provided 25 minutes of non-face-to-face time during this encounter.   Ann Held, DO [facility] [phone] (phone) [phone] (fax)  [place]

## 2020-10-30 ENCOUNTER — Telehealth: Payer: Self-pay | Admitting: Family Medicine

## 2020-10-30 NOTE — Telephone Encounter (Signed)
-----   Message from Ann Held, [place] sent at 10/29/2020  5:05 PM EDT ----- Pt needs to schedule a physical

## 2020-10-30 NOTE — Telephone Encounter (Signed)
Called patient to schedule  Patient answered and stated she could not schedule at time of call and said she would call back to make appt  Patient last CPE 11.21.21

## 2020-11-23 ENCOUNTER — Other Ambulatory Visit: Payer: Self-pay | Admitting: Family Medicine

## 2020-11-23 DIAGNOSIS — I10 Essential (primary) hypertension: Secondary | ICD-10-CM

## 2020-11-28 ENCOUNTER — Other Ambulatory Visit: Payer: Self-pay | Admitting: Family Medicine

## 2020-11-28 DIAGNOSIS — E785 Hyperlipidemia, unspecified: Secondary | ICD-10-CM

## 2020-12-10 ENCOUNTER — Telehealth: Payer: Self-pay | Admitting: Family Medicine

## 2020-12-10 NOTE — Telephone Encounter (Signed)
Pt would like a phone call returned for anxiety. Appointment scheduled for Friday @ 8:40. Please advise.

## 2020-12-11 NOTE — Telephone Encounter (Signed)
Spoke with patient. Pt states she was able to speak with her job about her concerns and will discuss further with [place] on Friday

## 2020-12-13 ENCOUNTER — Encounter: Payer: Self-pay | Admitting: Family Medicine

## 2020-12-13 ENCOUNTER — Ambulatory Visit (INDEPENDENT_AMBULATORY_CARE_PROVIDER_SITE_OTHER): Payer: BC Managed Care – PPO | Admitting: Family Medicine

## 2020-12-13 DIAGNOSIS — E785 Hyperlipidemia, unspecified: Secondary | ICD-10-CM | POA: Diagnosis not present

## 2020-12-13 DIAGNOSIS — F419 Anxiety disorder, unspecified: Secondary | ICD-10-CM | POA: Diagnosis not present

## 2020-12-13 DIAGNOSIS — I10 Essential (primary) hypertension: Secondary | ICD-10-CM | POA: Diagnosis not present

## 2020-12-13 MED ORDER — ALPRAZOLAM 0.25 MG PO TABS: 0.2500 mg | ORAL_TABLET | Freq: Two times a day (BID) | ORAL | 1 refills | Status: DC | PRN

## 2020-12-13 NOTE — Assessment & Plan Note (Signed)
Well controlled, no changes to meds. Encouraged heart healthy diet such as the DASH diet and exercise as tolerated.  °

## 2020-12-13 NOTE — Assessment & Plan Note (Signed)
con't lexapro and refill xanax prn  Recommended counseling -- list and number given to her I believe the steroid injection may have triggered the anxiety

## 2020-12-13 NOTE — Assessment & Plan Note (Signed)
Encourage heart healthy diet such as MIND or DASH diet, increase exercise, avoid trans fats, simple carbohydrates and processed foods, consider a krill or fish or flaxseed oil cap daily.  °

## 2020-12-13 NOTE — Patient Instructions (Signed)

## 2020-12-13 NOTE — Progress Notes (Signed)
f  Established Patient Office Visit  Subjective:  Patient ID: Susan Torres, female    DOB: 1955-10-09  Age: 65 y.o. MRN: [id number]  CC:  Chief Complaint  Patient presents with   Anxiety   Follow-up    HPI SHERE EISENHART presents for f/u anxiety.   The lexapro is helping but she ran out of the xanax and she would like a refill.  No other complaints   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   Bursitis    Hyperlipidemia    Hypertension    Post-operative nausea and vomiting    Uterine fibroid     Past Surgical History:  Procedure Laterality Date   BREAST CYST EXCISION     Rght Breast/benign   EYE SURGERY  2005   Right Eye   KNEE SURGERY  2002   Bilateral    Family History  Problem Relation Age of Onset   Stomach cancer Father    Alcohol abuse Father    Hypertension Mother    Diabetes Mother    Prostate cancer Brother    Kidney failure Brother    Alcoholism Brother    Diabetes Brother     Social History   Socioeconomic History   Marital status: Single    Spouse name: Not on file   Number of children: Not on file   Years of education: Not on file   Highest education level: Not on file  Occupational History   Occupation: [employer]  Tobacco Use   Smoking status: Never    Passive exposure: Yes   Smokeless tobacco: Never  Vaping Use   Vaping Use: Never used  Substance and Sexual Activity   Alcohol use: Yes    Alcohol/week: 2.0 standard drinks    Types: 2 Glasses of wine per week   Drug use: No   Sexual activity: Not Currently    Partners: Male  Other Topics Concern   Not on file  Social History Narrative   Not on file   Social Determinants of Health   Financial Resource Strain: Not on file  Food Insecurity: Not on file  Transportation Needs: Not on file  Physical Activity: Not on file  Stress: Not on file  Social Connections: Not on file  Intimate Partner Violence: Not on file     Outpatient Medications Prior to Visit  Medication Sig Dispense Refill   amLODipine-benazepril (LOTREL) 5-10 MG capsule TAKE 1 CAPSULE BY MOUTH EVERY DAY 90 capsule 0   aspirin EC 81 MG tablet Take 81 mg by mouth daily.     b complex vitamins tablet Take 1 tablet by mouth daily.     Calcium Carbonate-Vit D-Min (CALCIUM 1200 PO) Take by mouth daily.     celecoxib (CELEBREX) 200 MG capsule TAKE ONE CAPSULE BY MOUTH TWICE A DAY 180 capsule 2   cetirizine (ZYRTEC) 10 MG tablet Take 1 tablet (10 mg total) by mouth daily. 90 tablet 2   Cholecalciferol (VITAMIN D3) 50 MCG (2000 UT) TABS Take by mouth. Take 3 daily     diclofenac Sodium (VOLTAREN) 1 % GEL Apply 4 g topically 4 (four) times daily. 100 g 0   escitalopram (LEXAPRO) 10 MG tablet Take 1 tablet (10 mg total) by mouth daily. 90 tablet 3   fluticasone (FLONASE) 50 MCG/ACT nasal spray Place 1 spray into both nostrils as needed for allergies or rhinitis.  Green Tea 150 MG CAPS Take by mouth every other day.     ibandronate (BONIVA) 150 MG tablet TAKE 1 TABLET BY MOUTH EVERY 30 DAYS. 3 tablet 3   Multiple Vitamin (MULTIVITAMIN) tablet Take 1 tablet by mouth daily.     pravastatin (PRAVACHOL) 40 MG tablet TAKE 1 TABLET BY MOUTH EVERY DAY 90 tablet 0   PREMPRO 0.3-1.5 MG per tablet Take 1 tablet by mouth daily.      traMADol (ULTRAM) 50 MG tablet Take 1 tablet (50 mg total) by mouth every 12 (twelve) hours as needed. 15 tablet 0   Turmeric 450 MG CAPS Take by mouth daily.     ALPRAZolam (XANAX) 0.25 MG tablet Take 1 tablet (0.25 mg total) by mouth 2 (two) times daily as needed for anxiety. (Patient not taking: Reported on 12/13/2020) 20 tablet 1   No facility-administered medications prior to visit.    No Known Allergies  ROS Review of Systems  Constitutional:  Positive for fatigue. Negative for activity change, appetite change and unexpected weight change.  Respiratory:  Negative for cough and shortness of breath.   Cardiovascular:   Negative for chest pain and palpitations.  Psychiatric/Behavioral:  Negative for behavioral problems, confusion, decreased concentration and dysphoric mood. The patient is nervous/anxious.      Objective:    Physical Exam Vitals and nursing note reviewed.  Constitutional:      General: She is not in acute distress.    Appearance: She is well-developed.  HENT:     Head: Normocephalic and atraumatic.     Right Ear: External ear normal.     Left Ear: External ear normal.     Nose: Nose normal.  Eyes:     General:        Right eye: No discharge.        Left eye: No discharge.     Conjunctiva/sclera: Conjunctivae normal.     Pupils: Pupils are equal, round, and reactive to light.  Neck:     Thyroid: No thyromegaly.     Vascular: No carotid bruit or JVD.  Cardiovascular:     Rate and Rhythm: Normal rate and regular rhythm.     Heart sounds: Normal heart sounds. No murmur heard. Pulmonary:     Effort: Pulmonary effort is normal. No respiratory distress.     Breath sounds: Normal breath sounds. No wheezing or rales.  Chest:     Chest wall: No tenderness.  Abdominal:     General: Bowel sounds are normal.     Palpations: Abdomen is soft.     Tenderness: There is no abdominal tenderness.  Musculoskeletal:     Cervical back: Normal range of motion and neck supple.  Skin:    General: Skin is warm and dry.  Neurological:     Mental Status: She is alert and oriented to person, place, and time.  Psychiatric:        Attention and Perception: Attention and perception normal.        Mood and Affect: Mood is anxious. Mood is not depressed or elated. Affect is not labile, blunt, flat, angry, tearful or inappropriate.        Behavior: Behavior normal.        Thought Content: Thought content normal.        Cognition and Memory: Cognition and memory normal.        Judgment: Judgment normal.    BP 132/90 (BP Location: Left Arm, Patient Position: Sitting, Cuff Size: Normal)  Pulse 84    Temp 98.1 F (36.7 C) (Oral)   Resp 18   Ht 5' (1.524 m)   Wt 157 lb (71.2 kg)   SpO2 98%   BMI 30.66 kg/m  Wt Readings from Last 3 Encounters:  12/13/20 157 lb (71.2 kg)  10/01/20 156 lb 6.4 oz (70.9 kg)  11/16/19 155 lb 9.6 oz (70.6 kg)     Health Maintenance Due  Topic Date Due   MAMMOGRAM  05/06/2016   PAP SMEAR-Modifier  10/01/2019    There are no preventive care reminders to display for this patient.  Lab Results  Component Value Date   TSH 2.24 11/16/2019   Lab Results  Component Value Date   WBC 8.4 11/16/2019   HGB 15.2 11/16/2019   HCT 46.1 (H) 11/16/2019   MCV 87.3 11/16/2019   PLT 364 11/16/2019   Lab Results  Component Value Date   NA 139 11/16/2019   K 3.9 11/16/2019   CO2 27 11/16/2019   GLUCOSE 83 11/16/2019   BUN 11 11/16/2019   CREATININE 0.73 11/16/2019   BILITOT 0.5 11/16/2019   ALKPHOS 63 04/30/2016   AST 21 11/16/2019   ALT 17 11/16/2019   PROT 6.8 11/16/2019   ALBUMIN 4.3 04/30/2016   CALCIUM 9.7 11/16/2019   ANIONGAP 7 05/24/2017   GFR 109.08 05/14/2014   Lab Results  Component Value Date   CHOL 140 11/16/2019   Lab Results  Component Value Date   HDL 60 11/16/2019   Lab Results  Component Value Date   LDLCALC 66 11/16/2019   Lab Results  Component Value Date   TRIG 65 11/16/2019   Lab Results  Component Value Date   CHOLHDL 2.3 11/16/2019   Lab Results  Component Value Date   HGBA1C 5.8 (H) 07/30/2017      Assessment & Plan:   Problem List Items Addressed This Visit       Unprioritized   Anxiety    con't lexapro and refill xanax prn  Recommended counseling -- list and number given to her I believe the steroid injection may have triggered the anxiety       Relevant Medications   ALPRAZolam (XANAX) 0.25 MG tablet   Essential hypertension    Well controlled, no changes to meds. Encouraged heart healthy diet such as the DASH diet and exercise as tolerated.       Hyperlipidemia    Encourage heart  healthy diet such as MIND or DASH diet, increase exercise, avoid trans fats, simple carbohydrates and processed foods, consider a krill or fish or flaxseed oil cap daily.        Meds ordered this encounter  Medications   ALPRAZolam (XANAX) 0.25 MG tablet    Sig: Take 1 tablet (0.25 mg total) by mouth 2 (two) times daily as needed for anxiety.    Dispense:  30 tablet    Refill:  1    Follow-up: Return in about 4 weeks (around 01/10/2021), or anxiety.    Ann Held, DO

## 2020-12-19 ENCOUNTER — Encounter: Payer: Self-pay | Admitting: Family Medicine

## 2020-12-20 NOTE — Telephone Encounter (Signed)
Okay for letter

## 2020-12-23 DIAGNOSIS — Z1389 Encounter for screening for other disorder: Secondary | ICD-10-CM | POA: Diagnosis not present

## 2020-12-23 DIAGNOSIS — E669 Obesity, unspecified: Secondary | ICD-10-CM | POA: Diagnosis not present

## 2020-12-23 DIAGNOSIS — Z01419 Encounter for gynecological examination (general) (routine) without abnormal findings: Secondary | ICD-10-CM | POA: Diagnosis not present

## 2020-12-23 DIAGNOSIS — Z124 Encounter for screening for malignant neoplasm of cervix: Secondary | ICD-10-CM | POA: Diagnosis not present

## 2020-12-23 DIAGNOSIS — Z1231 Encounter for screening mammogram for malignant neoplasm of breast: Secondary | ICD-10-CM | POA: Diagnosis not present

## 2020-12-23 DIAGNOSIS — Z130 Encounter for screening for diseases of the blood and blood-forming organs and certain disorders involving the immune mechanism: Secondary | ICD-10-CM | POA: Diagnosis not present

## 2020-12-24 NOTE — Telephone Encounter (Signed)
Letter sent via Mychart

## 2021-01-03 ENCOUNTER — Telehealth: Payer: Self-pay | Admitting: Family Medicine

## 2021-01-03 NOTE — Telephone Encounter (Signed)
Noted  

## 2021-01-03 NOTE — Telephone Encounter (Signed)
Forms faxed into front office Placed into [place] bin up front

## 2021-01-09 ENCOUNTER — Ambulatory Visit (INDEPENDENT_AMBULATORY_CARE_PROVIDER_SITE_OTHER): Payer: BC Managed Care – PPO | Admitting: Family Medicine

## 2021-01-09 ENCOUNTER — Encounter: Payer: Self-pay | Admitting: Family Medicine

## 2021-01-09 VITALS — BP 140/94 | HR 83 | Temp 97.9°F | Resp 18 | Ht 60.0 in | Wt 161.4 lb

## 2021-01-09 DIAGNOSIS — E785 Hyperlipidemia, unspecified: Secondary | ICD-10-CM | POA: Diagnosis not present

## 2021-01-09 DIAGNOSIS — E538 Deficiency of other specified B group vitamins: Secondary | ICD-10-CM | POA: Diagnosis not present

## 2021-01-09 DIAGNOSIS — I10 Essential (primary) hypertension: Secondary | ICD-10-CM | POA: Diagnosis not present

## 2021-01-09 DIAGNOSIS — Z0000 Encounter for general adult medical examination without abnormal findings: Secondary | ICD-10-CM | POA: Diagnosis not present

## 2021-01-09 DIAGNOSIS — E559 Vitamin D deficiency, unspecified: Secondary | ICD-10-CM

## 2021-01-09 MED ORDER — AMLODIPINE BESY-BENAZEPRIL HCL 5-20 MG PO CAPS: 1.0000 | ORAL_CAPSULE | Freq: Every day | ORAL | 1 refills | Status: DC

## 2021-01-09 NOTE — Patient Instructions (Signed)

## 2021-01-09 NOTE — Progress Notes (Signed)
Subjective:     Susan Torres is a 66 y.o. female and is here for a comprehensive physical exam. The patient reports no problems.  Social History   Socioeconomic History   Marital status: Single    Spouse name: Not on file   Number of children: Not on file   Years of education: Not on file   Highest education level: Not on file  Occupational History   Occupation: [employer]  Tobacco Use   Smoking status: Never    Passive exposure: Yes   Smokeless tobacco: Never  Vaping Use   Vaping Use: Never used  Substance and Sexual Activity   Alcohol use: Yes    Alcohol/week: 2.0 standard drinks    Types: 2 Glasses of wine per week   Drug use: No   Sexual activity: Not Currently    Partners: Male  Other Topics Concern   Not on file  Social History Narrative   Not on file   Social Determinants of Health   Financial Resource Strain: Not on file  Food Insecurity: Not on file  Transportation Needs: Not on file  Physical Activity: Not on file  Stress: Not on file  Social Connections: Not on file  Intimate Partner Violence: Not on file   Health Maintenance  Topic Date Due   MAMMOGRAM  05/06/2016   PAP SMEAR-Modifier  10/01/2019   COLONOSCOPY (Pts 45-22yrs Insurance coverage will need to be confirmed)  08/30/2028   TETANUS/TDAP  09/25/2028   INFLUENZA VACCINE  Completed   COVID-19 Vaccine  Completed   Hepatitis C Screening  Completed   HIV Screening  Completed   Zoster Vaccines- Shingrix  Completed   Pneumococcal Vaccine 65-52 Years old  Aged Out   HPV VACCINES  Aged Out    The following portions of the patient's history were reviewed and updated as appropriate: She  has a past medical history of Allergy, Arthritis, Bursitis, Hyperlipidemia, Hypertension, Post-operative nausea and vomiting, and  Uterine fibroid. She does not have any pertinent problems on file. She  has a past surgical history that includes Eye surgery (2005); Knee surgery (2002); and Breast cyst excision. Her family history includes Alcohol abuse in her father; Alcoholism in her brother; Diabetes in her brother and mother; Hypertension in her mother; Kidney failure in her brother; Prostate cancer in her brother; Stomach cancer in her father. She  reports that she has never smoked. She has been exposed to tobacco smoke. She has never used smokeless tobacco. She reports current alcohol use of about 2.0 standard drinks per week. She reports that she does not use drugs. She has a current medication list which includes the following prescription(s): alprazolam, amlodipine-benazepril, aspirin ec, b complex vitamins, calcium carbonate-vit d-min, celecoxib, cetirizine, vitamin d3, diclofenac sodium, escitalopram, fluticasone, green tea, ibandronate, multivitamin, pravastatin, prempro, tramadol, and turmeric. Current Outpatient Medications on File Prior to Visit  Medication Sig Dispense Refill   ALPRAZolam (XANAX) 0.25 MG tablet Take 1 tablet (0.25 mg  total) by mouth 2 (two) times daily as needed for anxiety. 30 tablet 1   amLODipine-benazepril (LOTREL) 5-10 MG capsule TAKE 1 CAPSULE BY MOUTH EVERY DAY 90 capsule 0   aspirin EC 81 MG tablet Take 81 mg by mouth daily.     b complex vitamins tablet Take 1 tablet by mouth daily.     Calcium Carbonate-Vit D-Min (CALCIUM 1200 PO) Take by mouth daily.     celecoxib (CELEBREX) 200 MG capsule TAKE ONE CAPSULE BY MOUTH TWICE A DAY 180 capsule 2   cetirizine (ZYRTEC) 10 MG tablet Take 1 tablet (10 mg total) by mouth daily. 90 tablet 2   Cholecalciferol (VITAMIN D3) 50 MCG (2000 UT) TABS Take by mouth. Take 3 daily     diclofenac Sodium (VOLTAREN) 1 % GEL Apply 4 g topically 4 (four) times daily. 100 g 0   escitalopram (LEXAPRO) 10 MG tablet Take 1 tablet (10 mg total) by mouth daily. 90  tablet 3   fluticasone (FLONASE) 50 MCG/ACT nasal spray Place 1 spray into both nostrils as needed for allergies or rhinitis.     Green Tea 150 MG CAPS Take by mouth every other day.     ibandronate (BONIVA) 150 MG tablet TAKE 1 TABLET BY MOUTH EVERY 30 DAYS. 3 tablet 3   Multiple Vitamin (MULTIVITAMIN) tablet Take 1 tablet by mouth daily.     pravastatin (PRAVACHOL) 40 MG tablet TAKE 1 TABLET BY MOUTH EVERY DAY 90 tablet 0   PREMPRO 0.3-1.5 MG per tablet Take 1 tablet by mouth daily.      traMADol (ULTRAM) 50 MG tablet Take 1 tablet (50 mg total) by mouth every 12 (twelve) hours as needed. 15 tablet 0   Turmeric 450 MG CAPS Take by mouth daily.     No current facility-administered medications on file prior to visit.   She has No Known Allergies..  Review of Systems Review of Systems  Constitutional: Negative for activity change, appetite change and fatigue.  HENT: Negative for hearing loss, congestion, tinnitus and ear discharge.  dentist q62m Eyes: Negative for visual disturbance (see optho q1y -- vision corrected to 20/20 with glasses).  Respiratory: Negative for cough, chest tightness and shortness of breath.   Cardiovascular: Negative for chest pain, palpitations and leg swelling.  Gastrointestinal: Negative for abdominal pain, diarrhea, constipation and abdominal distention.  Genitourinary: Negative for urgency, frequency, decreased urine volume and difficulty urinating.  Musculoskeletal: Negative for back pain, arthralgias and gait problem.  Skin: Negative for color change, pallor and rash.  Neurological: Negative for dizziness, light-headedness, numbness and headaches.  Hematological: Negative for adenopathy. Does not bruise/bleed easily.  Psychiatric/Behavioral: Negative for suicidal ideas, confusion, sleep disturbance, self-injury, dysphoric mood, decreased concentration and agitation.      Objective:    BP 140/90 (BP Location: Left Arm, Patient Position: Sitting, Cuff  Size: Normal)    Pulse 83    Temp 97.9 F (36.6 C) (Oral)    Resp 18    Ht 5' (1.524 m)    Wt 161 lb 6.4 oz (73.2 kg)    SpO2 97%    BMI 31.52 kg/m  General appearance: alert, cooperative, appears stated age, and no distress Head: Normocephalic, without obvious abnormality, atraumatic Eyes: conjunctivae/corneas clear. PERRL, EOM's intact. Fundi benign. Ears: normal TM's and external ear canals both ears Nose: Nares normal. Septum midline. Mucosa normal. No drainage or sinus tenderness. Throat: lips, mucosa, and tongue normal; teeth and gums normal Neck: no adenopathy, no carotid bruit, no  JVD, supple, symmetrical, trachea midline, and thyroid not enlarged, symmetric, no tenderness/mass/nodules Back: symmetric, no curvature. ROM normal. No CVA tenderness. Lungs: clear to auscultation bilaterally Heart: regular rate and rhythm, S1, S2 normal, no murmur, click, rub or gallop Abdomen: soft, non-tender; bowel sounds normal; no masses,  no organomegaly Extremities: extremities normal, atraumatic, no cyanosis or edema Pulses: 2+ and symmetric Skin: Skin color, texture, turgor normal. No rashes or lesions Lymph nodes: Cervical, supraclavicular, and axillary nodes normal. Neurologic: Alert and oriented X 3, normal strength and tone. Normal symmetric reflexes. Normal coordination and gait    Assessment:    Healthy female exam.       Plan:     See After Visit Summary for Counseling Recommendations  Subjective:     Susan Torres is a 66 y.o. female and is here for a comprehensive physical exam. The patient reports no problems.  Social History   Socioeconomic History   Marital status: Single    Spouse name: Not on file   Number of children: Not on file   Years of education: Not on file   Highest education level: Not on file  Occupational History   Occupation: [employer]  Tobacco Use   Smoking status: Never    Passive exposure: Yes   Smokeless tobacco: Never  Vaping Use   Vaping  Use: Never used  Substance and Sexual Activity   Alcohol use: Yes    Alcohol/week: 2.0 standard drinks    Types: 2 Glasses of wine per week   Drug use: No   Sexual activity: Not Currently    Partners: Male  Other Topics Concern   Not on file  Social History Narrative         Exercising ---  walking 2-3 x a week   Social Determinants of Health   Financial Resource Strain: Not on file  Food Insecurity: Not on file  Transportation Needs: Not on file  Physical Activity: Not on file  Stress: Not on file  Social Connections: Not on file  Intimate Partner Violence: Not on file   Health Maintenance  Topic Date Due   MAMMOGRAM  05/06/2016   PAP SMEAR-Modifier  10/01/2019   COLONOSCOPY (Pts 45-34yrs Insurance coverage will need to be confirmed)  08/30/2028   TETANUS/TDAP  09/25/2028   INFLUENZA VACCINE  Completed   COVID-19 Vaccine  Completed   Hepatitis C Screening  Completed   HIV Screening  Completed   Zoster Vaccines- Shingrix  Completed   Pneumococcal Vaccine 59-5 Years old  Aged Out   HPV VACCINES  Aged Out    The following portions of the patient's history were reviewed and updated as appropriate: She  has a past medical history of Allergy, Arthritis, Bursitis, Hyperlipidemia, Hypertension, Post-operative nausea and vomiting, and Uterine fibroid. She does not have any pertinent problems on file. She  has a past surgical history that includes Eye surgery (2005); Knee surgery (2002); and Breast cyst excision. Her family history includes Alcohol abuse in her father; Alcoholism in her brother; Diabetes in her brother and mother; Hypertension in her mother; Kidney failure in her brother; Prostate cancer in her brother; Stomach cancer in her father. She  reports that she has never smoked. She has been exposed to tobacco smoke. She has never used smokeless tobacco. She reports current alcohol use of about 2.0 standard drinks per week. She reports that she does not use drugs. She  has a current medication list which includes the following prescription(s): alprazolam, amlodipine-benazepril, aspirin  ec, b complex vitamins, calcium carbonate-vit d-min, celecoxib, cetirizine, vitamin d3, diclofenac sodium, escitalopram, fluticasone, green tea, ibandronate, multivitamin, pravastatin, prempro, tramadol, and turmeric. Current Outpatient Medications on File Prior to Visit  Medication Sig Dispense Refill   ALPRAZolam (XANAX) 0.25 MG tablet Take 1 tablet (0.25 mg total) by mouth 2 (two) times daily as needed for anxiety. 30 tablet 1   aspirin EC 81 MG tablet Take 81 mg by mouth daily.     b complex vitamins tablet Take 1 tablet by mouth daily.     Calcium Carbonate-Vit D-Min (CALCIUM 1200 PO) Take by mouth daily.     celecoxib (CELEBREX) 200 MG capsule TAKE ONE CAPSULE BY MOUTH TWICE A DAY 180 capsule 2   cetirizine (ZYRTEC) 10 MG tablet Take 1 tablet (10 mg total) by mouth daily. 90 tablet 2   Cholecalciferol (VITAMIN D3) 50 MCG (2000 UT) TABS Take by mouth. Take 3 daily     diclofenac Sodium (VOLTAREN) 1 % GEL Apply 4 g topically 4 (four) times daily. 100 g 0   escitalopram (LEXAPRO) 10 MG tablet Take 1 tablet (10 mg total) by mouth daily. 90 tablet 3   fluticasone (FLONASE) 50 MCG/ACT nasal spray Place 1 spray into both nostrils as needed for allergies or rhinitis.     Green Tea 150 MG CAPS Take by mouth every other day.     ibandronate (BONIVA) 150 MG tablet TAKE 1 TABLET BY MOUTH EVERY 30 DAYS. 3 tablet 3   Multiple Vitamin (MULTIVITAMIN) tablet Take 1 tablet by mouth daily.     pravastatin (PRAVACHOL) 40 MG tablet TAKE 1 TABLET BY MOUTH EVERY DAY 90 tablet 0   PREMPRO 0.3-1.5 MG per tablet Take 1 tablet by mouth daily.      traMADol (ULTRAM) 50 MG tablet Take 1 tablet (50 mg total) by mouth every 12 (twelve) hours as needed. 15 tablet 0   Turmeric 450 MG CAPS Take by mouth daily.     No current facility-administered medications on file prior to visit.   She has No Known  Allergies..  Review of Systems Review of Systems  Constitutional: Negative for activity change, appetite change and fatigue.  HENT: Negative for hearing loss, congestion, tinnitus and ear discharge.  dentist q67m Eyes: Negative for visual disturbance (see optho q1y -- vision corrected to 20/20 with glasses).  Respiratory: Negative for cough, chest tightness and shortness of breath.   Cardiovascular: Negative for chest pain, palpitations and leg swelling.  Gastrointestinal: Negative for abdominal pain, diarrhea, constipation and abdominal distention.  Genitourinary: Negative for urgency, frequency, decreased urine volume and difficulty urinating.  Musculoskeletal: Negative for back pain, arthralgias and gait problem.  Skin: Negative for color change, pallor and rash.  Neurological: Negative for dizziness, light-headedness, numbness and headaches.  Hematological: Negative for adenopathy. Does not bruise/bleed easily.  Psychiatric/Behavioral: Negative for suicidal ideas, confusion, sleep disturbance, self-injury, dysphoric mood, decreased concentration and agitation.      Objective:    BP (!) 140/94 (BP Location: Left Arm, Cuff Size: Normal)    Pulse 83    Temp 97.9 F (36.6 C) (Oral)    Resp 18    Ht 5' (1.524 m)    Wt 161 lb 6.4 oz (73.2 kg)    SpO2 97%    BMI 31.52 kg/m  General appearance: alert, cooperative, appears stated age, and no distress Head: Normocephalic, without obvious abnormality, atraumatic Eyes: conjunctivae/corneas clear. PERRL, EOM's intact. Fundi benign. Ears: normal TM's and external ear canals both ears  Nose: Nares normal. Septum midline. Mucosa normal. No drainage or sinus tenderness. Throat: lips, mucosa, and tongue normal; teeth and gums normal Neck: no adenopathy, no carotid bruit, no JVD, supple, symmetrical, trachea midline, and thyroid not enlarged, symmetric, no tenderness/mass/nodules Back: symmetric, no curvature. ROM normal. No CVA tenderness. Lungs:  clear to auscultation bilaterally Heart: regular rate and rhythm, S1, S2 normal, no murmur, click, rub or gallop Abdomen: soft, non-tender; bowel sounds normal; no masses,  no organomegaly Extremities: extremities normal, atraumatic, no cyanosis or edema Pulses: 2+ and symmetric Skin: Skin color, texture, turgor normal. No rashes or lesions Lymph nodes: Cervical, supraclavicular, and axillary nodes normal. Neurologic: Alert and oriented X 3, normal strength and tone. Normal symmetric reflexes. Normal coordination and gait    Assessment:    Healthy female exam.     Plan:    Ghm utd Check labs See After Visit Summary for Counseling Recommendations   1. Preventative health care See above  - CBC with Differential/Platelet - Comprehensive metabolic panel - Lipid panel - TSH  2. Hypertension, unspecified type Poorly controlled will alter medications, encouraged DASH diet, minimize caffeine and obtain adequate sleep. Report concerning symptoms and follow up as directed and as needed  - amLODipine-benazepril (LOTREL) 5-20 MG capsule; Take 1 capsule by mouth daily.  Dispense: 90 capsule; Refill: 1 - CBC with Differential/Platelet - Comprehensive metabolic panel - Lipid panel - TSH  3. Hyperlipidemia, unspecified hyperlipidemia type Encourage heart healthy diet such as MIND or DASH diet, increase exercise, avoid trans fats, simple carbohydrates and processed foods, consider a krill or fish or flaxseed oil cap daily.   - CBC with Differential/Platelet - Comprehensive metabolic panel - Lipid panel - TSH  4. Vitamin B 12 deficiency  - Vitamin B12  5. Vitamin D deficiency   - VITAMIN D 25 Hydroxy (Vit-D Deficiency, Fractures)

## 2021-01-10 LAB — CBC WITH DIFFERENTIAL/PLATELET
Absolute Monocytes: 591 cells/uL (ref 200–950)
Basophils Absolute: 49 cells/uL (ref 0–200)
Basophils Relative: 0.6 %
Eosinophils Absolute: 284 cells/uL (ref 15–500)
Eosinophils Relative: 3.5 %
HCT: 45.8 % — ABNORMAL HIGH (ref 35.0–45.0)
Hemoglobin: 15.5 g/dL (ref 11.7–15.5)
Lymphs Abs: 1750 cells/uL (ref 850–3900)
MCH: 29.6 pg (ref 27.0–33.0)
MCHC: 33.8 g/dL (ref 32.0–36.0)
MCV: 87.4 fL (ref 80.0–100.0)
MPV: 10 fL (ref 7.5–12.5)
Monocytes Relative: 7.3 %
Neutro Abs: 5427 cells/uL (ref 1500–7800)
Neutrophils Relative %: 67 %
Platelets: 348 10*3/uL (ref 140–400)
RBC: 5.24 10*6/uL — ABNORMAL HIGH (ref 3.80–5.10)
RDW: 13.3 % (ref 11.0–15.0)
Total Lymphocyte: 21.6 %
WBC: 8.1 10*3/uL (ref 3.8–10.8)

## 2021-01-10 LAB — COMPREHENSIVE METABOLIC PANEL
AG Ratio: 1.8 (calc) (ref 1.0–2.5)
ALT: 19 U/L (ref 6–29)
AST: 20 U/L (ref 10–35)
Albumin: 4.5 g/dL (ref 3.6–5.1)
Alkaline phosphatase (APISO): 65 U/L (ref 37–153)
BUN: 9 mg/dL (ref 7–25)
CO2: 26 mmol/L (ref 20–32)
Calcium: 9.5 mg/dL (ref 8.6–10.4)
Chloride: 102 mmol/L (ref 98–110)
Creat: 0.65 mg/dL (ref 0.50–1.05)
Globulin: 2.5 g/dL (calc) (ref 1.9–3.7)
Glucose, Bld: 75 mg/dL (ref 65–99)
Potassium: 4.2 mmol/L (ref 3.5–5.3)
Sodium: 138 mmol/L (ref 135–146)
Total Bilirubin: 0.5 mg/dL (ref 0.2–1.2)
Total Protein: 7 g/dL (ref 6.1–8.1)

## 2021-01-10 LAB — VITAMIN D 25 HYDROXY (VIT D DEFICIENCY, FRACTURES): Vit D, 25-Hydroxy: 46 ng/mL (ref 30–100)

## 2021-01-10 LAB — LIPID PANEL
Cholesterol: 168 mg/dL (ref <200)
HDL: 59 mg/dL (ref >=50)
LDL Cholesterol (Calc): 91 mg/dL (calc)
Non-HDL Cholesterol (Calc): 109 mg/dL (calc) (ref <130)
Total CHOL/HDL Ratio: 2.8 (calc) (ref <5.0)
Triglycerides: 88 mg/dL (ref <150)

## 2021-01-10 LAB — TSH: TSH: 1.82 mIU/L (ref 0.40–4.50)

## 2021-01-10 LAB — VITAMIN B12: Vitamin B-12: 810 pg/mL (ref 200–1100)

## 2021-01-18 ENCOUNTER — Other Ambulatory Visit: Payer: Self-pay | Admitting: Obstetrics and Gynecology

## 2021-01-18 DIAGNOSIS — R928 Other abnormal and inconclusive findings on diagnostic imaging of breast: Secondary | ICD-10-CM

## 2021-01-27 DIAGNOSIS — R922 Inconclusive mammogram: Secondary | ICD-10-CM | POA: Diagnosis not present

## 2021-01-27 DIAGNOSIS — R928 Other abnormal and inconclusive findings on diagnostic imaging of breast: Secondary | ICD-10-CM | POA: Diagnosis not present

## 2021-01-27 LAB — HM MAMMOGRAPHY

## 2021-02-11 ENCOUNTER — Encounter: Payer: Self-pay | Admitting: Family Medicine

## 2021-02-21 ENCOUNTER — Encounter: Payer: Self-pay | Admitting: Family Medicine

## 2021-02-21 DIAGNOSIS — R87619 Unspecified abnormal cytological findings in specimens from cervix uteri: Secondary | ICD-10-CM | POA: Diagnosis not present

## 2021-02-21 DIAGNOSIS — B977 Papillomavirus as the cause of diseases classified elsewhere: Secondary | ICD-10-CM | POA: Diagnosis not present

## 2021-02-21 DIAGNOSIS — N870 Mild cervical dysplasia: Secondary | ICD-10-CM | POA: Diagnosis not present

## 2021-03-03 ENCOUNTER — Other Ambulatory Visit: Payer: BC Managed Care – PPO

## 2021-03-06 DIAGNOSIS — M7061 Trochanteric bursitis, right hip: Secondary | ICD-10-CM | POA: Diagnosis not present

## 2021-03-06 DIAGNOSIS — M7062 Trochanteric bursitis, left hip: Secondary | ICD-10-CM | POA: Diagnosis not present

## 2021-03-10 ENCOUNTER — Other Ambulatory Visit: Payer: Self-pay | Admitting: Family Medicine

## 2021-03-10 DIAGNOSIS — E785 Hyperlipidemia, unspecified: Secondary | ICD-10-CM

## 2021-08-05 ENCOUNTER — Other Ambulatory Visit: Payer: Self-pay | Admitting: Family Medicine

## 2021-08-05 DIAGNOSIS — I10 Essential (primary) hypertension: Secondary | ICD-10-CM

## 2021-09-05 ENCOUNTER — Other Ambulatory Visit: Payer: Self-pay | Admitting: Family Medicine

## 2021-09-05 DIAGNOSIS — I10 Essential (primary) hypertension: Secondary | ICD-10-CM

## 2021-09-06 ENCOUNTER — Other Ambulatory Visit: Payer: Self-pay | Admitting: Family Medicine

## 2021-09-06 DIAGNOSIS — E785 Hyperlipidemia, unspecified: Secondary | ICD-10-CM

## 2021-09-13 ENCOUNTER — Other Ambulatory Visit: Payer: Self-pay | Admitting: Family Medicine

## 2021-09-13 DIAGNOSIS — M8580 Other specified disorders of bone density and structure, unspecified site: Secondary | ICD-10-CM

## 2021-09-27 ENCOUNTER — Other Ambulatory Visit: Payer: Self-pay | Admitting: Family Medicine

## 2021-09-27 DIAGNOSIS — I10 Essential (primary) hypertension: Secondary | ICD-10-CM

## 2021-10-12 ENCOUNTER — Other Ambulatory Visit: Payer: Self-pay | Admitting: Family Medicine

## 2021-10-12 DIAGNOSIS — E785 Hyperlipidemia, unspecified: Secondary | ICD-10-CM

## 2021-10-28 ENCOUNTER — Other Ambulatory Visit: Payer: Self-pay | Admitting: Family Medicine

## 2021-10-28 DIAGNOSIS — E785 Hyperlipidemia, unspecified: Secondary | ICD-10-CM

## 2021-10-29 ENCOUNTER — Other Ambulatory Visit: Payer: Self-pay | Admitting: Family Medicine

## 2021-10-29 DIAGNOSIS — I10 Essential (primary) hypertension: Secondary | ICD-10-CM

## 2021-11-02 ENCOUNTER — Other Ambulatory Visit: Payer: Self-pay | Admitting: Family Medicine

## 2021-11-02 DIAGNOSIS — I10 Essential (primary) hypertension: Secondary | ICD-10-CM

## 2021-11-03 ENCOUNTER — Encounter: Payer: Self-pay | Admitting: *Deleted

## 2021-12-06 ENCOUNTER — Other Ambulatory Visit: Payer: Self-pay | Admitting: Family Medicine

## 2021-12-06 DIAGNOSIS — I10 Essential (primary) hypertension: Secondary | ICD-10-CM

## 2021-12-10 ENCOUNTER — Other Ambulatory Visit: Payer: Self-pay | Admitting: Family Medicine

## 2021-12-10 DIAGNOSIS — E785 Hyperlipidemia, unspecified: Secondary | ICD-10-CM

## 2022-01-08 ENCOUNTER — Encounter: Payer: BC Managed Care – PPO | Admitting: Family Medicine

## 2022-01-10 ENCOUNTER — Other Ambulatory Visit: Payer: Self-pay | Admitting: Family Medicine

## 2022-01-10 DIAGNOSIS — E785 Hyperlipidemia, unspecified: Secondary | ICD-10-CM

## 2022-01-16 ENCOUNTER — Ambulatory Visit (INDEPENDENT_AMBULATORY_CARE_PROVIDER_SITE_OTHER): Payer: BC Managed Care – PPO | Admitting: Family Medicine

## 2022-01-16 ENCOUNTER — Encounter: Payer: Self-pay | Admitting: Family Medicine

## 2022-01-16 VITALS — BP 140/100 | HR 74 | Temp 97.9°F | Resp 18 | Ht 60.0 in | Wt 166.8 lb

## 2022-01-16 DIAGNOSIS — I10 Essential (primary) hypertension: Secondary | ICD-10-CM

## 2022-01-16 DIAGNOSIS — Z0000 Encounter for general adult medical examination without abnormal findings: Secondary | ICD-10-CM

## 2022-01-16 DIAGNOSIS — E2839 Other primary ovarian failure: Secondary | ICD-10-CM

## 2022-01-16 DIAGNOSIS — F419 Anxiety disorder, unspecified: Secondary | ICD-10-CM

## 2022-01-16 DIAGNOSIS — E785 Hyperlipidemia, unspecified: Secondary | ICD-10-CM | POA: Diagnosis not present

## 2022-01-16 DIAGNOSIS — M159 Polyosteoarthritis, unspecified: Secondary | ICD-10-CM

## 2022-01-16 DIAGNOSIS — E559 Vitamin D deficiency, unspecified: Secondary | ICD-10-CM

## 2022-01-16 DIAGNOSIS — M25559 Pain in unspecified hip: Secondary | ICD-10-CM

## 2022-01-16 DIAGNOSIS — E538 Deficiency of other specified B group vitamins: Secondary | ICD-10-CM | POA: Diagnosis not present

## 2022-01-16 LAB — LIPID PANEL
Cholesterol: 148 mg/dL (ref 0–200)
HDL: 51.7 mg/dL (ref >39.00)
LDL Cholesterol: 79 mg/dL (ref 0–99)
NonHDL: 96.22
Total CHOL/HDL Ratio: 3
Triglycerides: 86 mg/dL (ref 0.0–149.0)
VLDL: 17.2 mg/dL (ref 0.0–40.0)

## 2022-01-16 LAB — VITAMIN D 25 HYDROXY (VIT D DEFICIENCY, FRACTURES): VITD: 38.73 ng/mL (ref 30.00–100.00)

## 2022-01-16 LAB — COMPREHENSIVE METABOLIC PANEL
ALT: 44 U/L — ABNORMAL HIGH (ref 0–35)
AST: 32 U/L (ref 0–37)
Albumin: 4.6 g/dL (ref 3.5–5.2)
Alkaline Phosphatase: 77 U/L (ref 39–117)
BUN: 10 mg/dL (ref 6–23)
CO2: 28 mEq/L (ref 19–32)
Calcium: 9.3 mg/dL (ref 8.4–10.5)
Chloride: 103 mEq/L (ref 96–112)
Creatinine, Ser: 0.69 mg/dL (ref 0.40–1.20)
GFR: 91.48 mL/min (ref >60.00)
Glucose, Bld: 96 mg/dL (ref 70–99)
Potassium: 3.8 mEq/L (ref 3.5–5.1)
Sodium: 140 mEq/L (ref 135–145)
Total Bilirubin: 0.4 mg/dL (ref 0.2–1.2)
Total Protein: 7.1 g/dL (ref 6.0–8.3)

## 2022-01-16 LAB — CBC WITH DIFFERENTIAL/PLATELET
Basophils Absolute: 0.1 10*3/uL (ref 0.0–0.1)
Basophils Relative: 0.9 % (ref 0.0–3.0)
Eosinophils Absolute: 0.6 10*3/uL (ref 0.0–0.7)
Eosinophils Relative: 8.3 % — ABNORMAL HIGH (ref 0.0–5.0)
HCT: 45.5 % (ref 36.0–46.0)
Hemoglobin: 15.2 g/dL — ABNORMAL HIGH (ref 12.0–15.0)
Lymphocytes Relative: 22.3 % (ref 12.0–46.0)
Lymphs Abs: 1.7 10*3/uL (ref 0.7–4.0)
MCHC: 33.3 g/dL (ref 30.0–36.0)
MCV: 85.7 fl (ref 78.0–100.0)
Monocytes Absolute: 0.5 10*3/uL (ref 0.1–1.0)
Monocytes Relative: 6.7 % (ref 3.0–12.0)
Neutro Abs: 4.7 10*3/uL (ref 1.4–7.7)
Neutrophils Relative %: 61.8 % (ref 43.0–77.0)
Platelets: 344 10*3/uL (ref 150.0–400.0)
RBC: 5.31 Mil/uL — ABNORMAL HIGH (ref 3.87–5.11)
RDW: 14.3 % (ref 11.5–15.5)
WBC: 7.6 10*3/uL (ref 4.0–10.5)

## 2022-01-16 LAB — VITAMIN B12: Vitamin B-12: 569 pg/mL (ref 211–911)

## 2022-01-16 LAB — TSH: TSH: 1.76 u[IU]/mL (ref 0.35–5.50)

## 2022-01-16 MED ORDER — CELECOXIB 200 MG PO CAPS: 200.0000 mg | ORAL_CAPSULE | Freq: Two times a day (BID) | ORAL | 2 refills | Status: DC

## 2022-01-16 MED ORDER — AMLODIPINE BESY-BENAZEPRIL HCL 10-20 MG PO CAPS: 1.0000 | ORAL_CAPSULE | Freq: Every day | ORAL | 1 refills | Status: DC

## 2022-01-16 NOTE — Progress Notes (Signed)
Subjective:   By signing my name below, I, Shehryar Baig, attest that this documentation has been prepared under the direction and in the presence of Ann Held, DO. 01/16/2022   Patient ID: Susan Torres, female    DOB: August 10, 1955, 67 y.o.   MRN: [id number]  Chief Complaint  Patient presents with   Annual Exam    Pt states fasting     HPI Patient is in today for a comprehensive physical exam.    Her blood pressure is high during today's visit. She has not checked her blood pressure at home recently due to her machines batteries running low.  BP Readings from Last 3 Encounters:  01/16/22 (!) 140/100  01/09/21 (!) 140/94  12/13/20 132/90   Pulse Readings from Last 3 Encounters:  01/16/22 74  01/09/21 83  12/13/20 84   She continues taking Celebrex with positive results. Her GYN has retired and she is not following up with another provider at this time.  She is interested in refilling her Celebrex Rx.  She denies having any fever, new moles, congestion, sore throat, new muscle pain, new joint pain, chest pain, cough, SOB, wheezing, n/v/d, constipation, blood in stool, dysuria, frequency, hematuria, or headaches at this time. She has retired from her job recently. She has no changes in family medical history. She had a tooth crown removed recently in order to have an implant placed, otherwise she has no new surgeries to report.  She is due for a Dexa scan. She has not received her second Covid-19 vaccine. She is UTD on eye and dental care. She will be due for her pneumonia vaccine in 2025.  She is walking regularly and doing pilate's.  Mammogram: Last completed 01/27/2021. Result are normal. Repeat in 1 year.  Dexa: Last completed 10/10/2018. Results show she has low bone density. Repeat in 2 years.  Pap smear: Last completed 12/23/2020.  Colonoscopy: Last completed 08/31/2018. Results decreased sphincter tone found on digital rectal exam, mild diverticulosis in  sigmoid colon and descending colon and transverse colon and ascending colon, non-bleeding external and internal hemorrhoids, otherwise results are normal. Repeat in 10 years.   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   Bursitis    Hyperlipidemia    Hypertension    Post-operative nausea and vomiting    Uterine fibroid     Past Surgical History:  Procedure Laterality Date   BREAST CYST EXCISION     Rght Breast/benign   EYE SURGERY  2005   Right Eye   KNEE SURGERY  2002   Bilateral    Family History  Problem Relation Age of Onset   Stomach cancer Father    Alcohol abuse Father    Hypertension Mother    Diabetes Mother    Prostate cancer Brother    Kidney failure Brother    Alcoholism Brother    Diabetes Brother     Social History   Socioeconomic History   Marital status: Single    Spouse name: Not on file   Number of children: Not on file   Years of education: Not on file   Highest education level: Not on file  Occupational History   Occupation: [employer]  Tobacco Use   Smoking status: Never    Passive exposure: Yes   Smokeless tobacco: Never  Vaping Use   Vaping Use: Never used  Substance and Sexual Activity   Alcohol  use: Yes    Alcohol/week: 2.0 standard drinks of alcohol    Types: 2 Glasses of wine per week   Drug use: No   Sexual activity: Not Currently    Partners: Male  Other Topics Concern   Not on file  Social History Narrative         Exercising ---  walking 2-3 x a week      Pilates    Social Determinants of Health   Financial Resource Strain: Not on file  Food Insecurity: Not on file  Transportation Needs: Not on file  Physical Activity: Not on file  Stress: Not on file  Social Connections: Not on file  Intimate Partner Violence: Not on file    Outpatient Medications Prior to Visit  Medication Sig Dispense Refill   ALPRAZolam (XANAX) 0.25 MG tablet Take 1 tablet (0.25 mg total) by mouth  2 (two) times daily as needed for anxiety. 30 tablet 1   aspirin EC 81 MG tablet Take 81 mg by mouth daily.     b complex vitamins tablet Take 1 tablet by mouth daily.     Calcium Carbonate-Vit D-Min (CALCIUM 1200 PO) Take by mouth daily.     cetirizine (ZYRTEC) 10 MG tablet Take 1 tablet (10 mg total) by mouth daily. 90 tablet 2   Cholecalciferol (VITAMIN D3) 50 MCG (2000 UT) TABS Take by mouth. Take 3 daily     diclofenac Sodium (VOLTAREN) 1 % GEL Apply 4 g topically 4 (four) times daily. 100 g 0   escitalopram (LEXAPRO) 10 MG tablet Take 1 tablet (10 mg total) by mouth daily. 90 tablet 3   fluticasone (FLONASE) 50 MCG/ACT nasal spray Place 1 spray into both nostrils as needed for allergies or rhinitis.     Green Tea 150 MG CAPS Take by mouth every other day.     ibandronate (BONIVA) 150 MG tablet Take 1 tablet (150 mg total) by mouth every 30 (thirty) days. 3 tablet 3   Multiple Vitamin (MULTIVITAMIN) tablet Take 1 tablet by mouth daily.     pravastatin (PRAVACHOL) 40 MG tablet Take 1 tablet (40 mg total) by mouth daily. Pt needs office visit for further refill. 30 tablet 0   traMADol (ULTRAM) 50 MG tablet Take 1 tablet (50 mg total) by mouth every 12 (twelve) hours as needed. 15 tablet 0   Turmeric 450 MG CAPS Take by mouth daily.     amLODipine-benazepril (LOTREL) 5-20 MG capsule TAKE 1 CAPSULE BY MOUTH EVERY DAY 90 capsule 1   celecoxib (CELEBREX) 200 MG capsule TAKE ONE CAPSULE BY MOUTH TWICE A DAY 180 capsule 2   PREMPRO 0.3-1.5 MG per tablet Take 1 tablet by mouth daily.      No facility-administered medications prior to visit.    No Known Allergies  Review of Systems  Constitutional:  Negative for fever and malaise/fatigue.  HENT:  Negative for congestion, sinus pain and sore throat.   Eyes:  Negative for blurred vision.  Respiratory:  Negative for cough, shortness of breath and wheezing.   Cardiovascular:  Negative for chest pain, palpitations and leg swelling.   Gastrointestinal:  Negative for abdominal pain, blood in stool, constipation, diarrhea, nausea and vomiting.  Genitourinary:  Negative for dysuria, frequency and hematuria.  Musculoskeletal:  Negative for back pain and falls.       (-)new muscle pain (-)new joint pain  Skin:  Negative for rash.       (-)New moles  Neurological:  Negative for  dizziness, loss of consciousness and headaches.  Endo/Heme/Allergies:  Negative for environmental allergies.  Psychiatric/Behavioral:  Negative for depression. The patient is not nervous/anxious.        Objective:    Physical Exam Vitals and nursing note reviewed.  Constitutional:      General: She is not in acute distress.    Appearance: Normal appearance. She is not ill-appearing.  HENT:     Head: Normocephalic and atraumatic.     Right Ear: Tympanic membrane, ear canal and external ear normal.     Left Ear: Tympanic membrane, ear canal and external ear normal.     Mouth/Throat:     Mouth: Mucous membranes are moist.     Pharynx: Oropharynx is clear. No oropharyngeal exudate or posterior oropharyngeal erythema.  Eyes:     Extraocular Movements: Extraocular movements intact.     Pupils: Pupils are equal, round, and reactive to light.  Cardiovascular:     Rate and Rhythm: Normal rate and regular rhythm.     Heart sounds: Normal heart sounds. No murmur heard.    No gallop.  Pulmonary:     Effort: Pulmonary effort is normal. No respiratory distress.     Breath sounds: Normal breath sounds. No wheezing or rales.  Abdominal:     General: Bowel sounds are normal. There is no distension.     Palpations: Abdomen is soft.     Tenderness: There is no abdominal tenderness. There is no guarding.  Musculoskeletal:        General: Normal range of motion.     Comments: Pt con't with hip pain but the celebrex is controlling the pain  Skin:    General: Skin is warm and dry.  Neurological:     General: No focal deficit present.     Mental Status:  She is alert and oriented to person, place, and time.  Psychiatric:        Mood and Affect: Mood normal.        Behavior: Behavior normal.        Thought Content: Thought content normal.        Judgment: Judgment normal.     BP (!) 140/100 (BP Location: Left Arm, Patient Position: Sitting, Cuff Size: Normal)   Pulse 74   Temp 97.9 F (36.6 C) (Oral)   Resp 18   Ht 5' (1.524 m)   Wt 166 lb 12.8 oz (75.7 kg)   SpO2 94%   BMI 32.58 kg/m  Wt Readings from Last 3 Encounters:  01/16/22 166 lb 12.8 oz (75.7 kg)  01/09/21 161 lb 6.4 oz (73.2 kg)  12/13/20 157 lb (71.2 kg)       Assessment & Plan:  Preventative health care Assessment & Plan: Ghm utd Check labs See AVS   Orders: -     CBC with Differential/Platelet -     Comprehensive metabolic panel -     Lipid panel -     TSH  Hypertension, unspecified type -     CBC with Differential/Platelet -     Comprehensive metabolic panel -     Lipid panel -     TSH -     amLODIPine Besy-Benazepril HCl; Take 1 capsule by mouth daily.  Dispense: 90 capsule; Refill: 1  Hyperlipidemia, unspecified hyperlipidemia type Assessment & Plan: Encourage heart healthy diet such as MIND or DASH diet, increase exercise, avoid trans fats, simple carbohydrates and processed foods, consider a krill or fish or flaxseed oil cap daily.  Orders: -     CBC with Differential/Platelet -     Comprehensive metabolic panel -     Lipid panel -     TSH  Vitamin B 12 deficiency -     Vitamin B12  Vitamin D deficiency -     VITAMIN D 25 Hydroxy (Vit-D Deficiency, Fractures)  Anxiety  Primary osteoarthritis involving multiple joints -     Celecoxib; Take 1 capsule (200 mg total) by mouth 2 (two) times daily.  Dispense: 180 capsule; Refill: 2  Estrogen deficiency -     DG Bone Density; Future  Essential hypertension Assessment & Plan: Poorly controlled will alter medications, encouraged DASH diet, minimize caffeine and obtain adequate sleep.  Report concerning symptoms and follow up as directed and as needed    Hip pain, unspecified laterality Assessment & Plan: Con't celebrex      I, Ann Held, DO, personally preformed the services described in this documentation.  All medical record entries made by the scribe were at my direction and in my presence.  I have reviewed the chart and discharge instructions (if applicable) and agree that the record reflects my personal performance and is accurate and complete. 01/16/2022   I,Shehryar Baig,acting as a scribe for Ann Held, DO.,have documented all relevant documentation on the behalf of Ann Held, DO,as directed by  Ann Held, DO while in the presence of Ann Held, DO.   Ann Held, DO

## 2022-01-16 NOTE — Assessment & Plan Note (Signed)
Encourage heart healthy diet such as MIND or DASH diet, increase exercise, avoid trans fats, simple carbohydrates and processed foods, consider a krill or fish or flaxseed oil cap daily.  °

## 2022-01-16 NOTE — Assessment & Plan Note (Signed)
Poorly controlled will alter medications, encouraged DASH diet, minimize caffeine and obtain adequate sleep. Report concerning symptoms and follow up as directed and as needed 

## 2022-01-16 NOTE — Assessment & Plan Note (Signed)
Ghm utd Check labs  See AVS  

## 2022-01-16 NOTE — Assessment & Plan Note (Signed)
Con't celebrex

## 2022-01-16 NOTE — Patient Instructions (Signed)
Preventive Care 40-64 Years Old, Female Preventive care refers to lifestyle choices and visits with your health care provider that can promote health and wellness. Preventive care visits are also called wellness exams. What can I expect for my preventive care visit? Counseling Your health care provider may ask you questions about your: Medical history, including: Past medical problems. Family medical history. Pregnancy history. Current health, including: Menstrual cycle. Method of birth control. Emotional well-being. Home life and relationship well-being. Sexual activity and sexual health. Lifestyle, including: Alcohol, nicotine or tobacco, and drug use. Access to firearms. Diet, exercise, and sleep habits. Work and work environment. Sunscreen use. Safety issues such as seatbelt and bike helmet use. Physical exam Your health care provider will check your: Height and weight. These may be used to calculate your BMI (body mass index). BMI is a measurement that tells if you are at a healthy weight. Waist circumference. This measures the distance around your waistline. This measurement also tells if you are at a healthy weight and may help predict your risk of certain diseases, such as type 2 diabetes and high blood pressure. Heart rate and blood pressure. Body temperature. Skin for abnormal spots. What immunizations do I need?  Vaccines are usually given at various ages, according to a schedule. Your health care provider will recommend vaccines for you based on your age, medical history, and lifestyle or other factors, such as travel or where you work. What tests do I need? Screening Your health care provider may recommend screening tests for certain conditions. This may include: Lipid and cholesterol levels. Diabetes screening. This is done by checking your blood sugar (glucose) after you have not eaten for a while (fasting). Pelvic exam and Pap test. Hepatitis B test. Hepatitis C  test. HIV (human immunodeficiency virus) test. STI (sexually transmitted infection) testing, if you are at risk. Lung cancer screening. Colorectal cancer screening. Mammogram. Talk with your health care provider about when you should start having regular mammograms. This may depend on whether you have a family history of breast cancer. BRCA-related cancer screening. This may be done if you have a family history of breast, ovarian, tubal, or peritoneal cancers. Bone density scan. This is done to screen for osteoporosis. Talk with your health care provider about your test results, treatment options, and if necessary, the need for more tests. Follow these instructions at home: Eating and drinking  Eat a diet that includes fresh fruits and vegetables, whole grains, lean protein, and low-fat dairy products. Take vitamin and mineral supplements as recommended by your health care provider. Do not drink alcohol if: Your health care provider tells you not to drink. You are pregnant, may be pregnant, or are planning to become pregnant. If you drink alcohol: Limit how much you have to 0-1 drink a day. Know how much alcohol is in your drink. In the [place] one drink equals one 12 oz bottle of beer (355 mL), one 5 oz glass of wine (148 mL), or one 1 oz glass of hard liquor (44 mL). Lifestyle Brush your teeth every morning and night with fluoride toothpaste. Floss one time each day. Exercise for at least 30 minutes 5 or more days each week. Do not use any products that contain nicotine or tobacco. These products include cigarettes, chewing tobacco, and vaping devices, such as e-cigarettes. If you need help quitting, ask your health care provider. Do not use drugs. If you are sexually active, practice safe sex. Use a condom or other form of protection to   prevent STIs. If you do not wish to become pregnant, use a form of birth control. If you plan to become pregnant, see your health care provider for a  prepregnancy visit. Take aspirin only as told by your health care provider. Make sure that you understand how much to take and what form to take. Work with your health care provider to find out whether it is safe and beneficial for you to take aspirin daily. Find healthy ways to manage stress, such as: Meditation, yoga, or listening to music. Journaling. Talking to a trusted person. Spending time with friends and family. Minimize exposure to UV radiation to reduce your risk of skin cancer. Safety Always wear your seat belt while driving or riding in a vehicle. Do not drive: If you have been drinking alcohol. Do not ride with someone who has been drinking. When you are tired or distracted. While texting. If you have been using any mind-altering substances or drugs. Wear a helmet and other protective equipment during sports activities. If you have firearms in your house, make sure you follow all gun safety procedures. Seek help if you have been physically or sexually abused. What's next? Visit your health care provider once a year for an annual wellness visit. Ask your health care provider how often you should have your eyes and teeth checked. Stay up to date on all vaccines. This information is not intended to replace advice given to you by your health care provider. Make sure you discuss any questions you have with your health care provider. Document Revised: 06/19/2020 Document Reviewed: 06/19/2020 Elsevier Patient Education  [place].

## 2022-01-17 ENCOUNTER — Encounter: Payer: Self-pay | Admitting: Family Medicine

## 2022-02-07 ENCOUNTER — Other Ambulatory Visit: Payer: Self-pay | Admitting: Family Medicine

## 2022-02-07 DIAGNOSIS — E785 Hyperlipidemia, unspecified: Secondary | ICD-10-CM

## 2022-04-08 ENCOUNTER — Telehealth: Payer: Self-pay | Admitting: Family Medicine

## 2022-04-08 ENCOUNTER — Encounter: Payer: Self-pay | Admitting: Family Medicine

## 2022-04-08 DIAGNOSIS — E785 Hyperlipidemia, unspecified: Secondary | ICD-10-CM | POA: Diagnosis not present

## 2022-04-08 DIAGNOSIS — I10 Essential (primary) hypertension: Secondary | ICD-10-CM | POA: Diagnosis not present

## 2022-04-08 DIAGNOSIS — Z833 Family history of diabetes mellitus: Secondary | ICD-10-CM

## 2022-04-08 DIAGNOSIS — G8929 Other chronic pain: Secondary | ICD-10-CM | POA: Diagnosis not present

## 2022-04-08 DIAGNOSIS — E669 Obesity, unspecified: Secondary | ICD-10-CM | POA: Diagnosis not present

## 2022-04-08 NOTE — Telephone Encounter (Signed)
Susan Torres. NP with [facility] called, to verify the dates that the patient has gotten their A1C checked. She stated that the last one she sees on her records was on 2019. She would like a call back to discuss. Please advise.  Susan Antu, NP  [phone]

## 2022-04-08 NOTE — Telephone Encounter (Signed)
Returned call. LVM confirming last A1C

## 2022-04-13 ENCOUNTER — Other Ambulatory Visit (INDEPENDENT_AMBULATORY_CARE_PROVIDER_SITE_OTHER): Payer: HMO

## 2022-04-13 DIAGNOSIS — Z833 Family history of diabetes mellitus: Secondary | ICD-10-CM | POA: Diagnosis not present

## 2022-04-13 LAB — HEMOGLOBIN A1C: Hgb A1c MFr Bld: 6.2 % (ref 4.6–6.5)

## 2022-04-15 DIAGNOSIS — Z8742 Personal history of other diseases of the female genital tract: Secondary | ICD-10-CM | POA: Diagnosis not present

## 2022-04-15 DIAGNOSIS — Z1231 Encounter for screening mammogram for malignant neoplasm of breast: Secondary | ICD-10-CM | POA: Diagnosis not present

## 2022-04-15 DIAGNOSIS — Z01419 Encounter for gynecological examination (general) (routine) without abnormal findings: Secondary | ICD-10-CM | POA: Diagnosis not present

## 2022-04-15 DIAGNOSIS — E2839 Other primary ovarian failure: Secondary | ICD-10-CM | POA: Diagnosis not present

## 2022-04-16 ENCOUNTER — Other Ambulatory Visit: Payer: Self-pay | Admitting: Obstetrics and Gynecology

## 2022-04-16 DIAGNOSIS — E2839 Other primary ovarian failure: Secondary | ICD-10-CM

## 2022-04-21 LAB — HM PAP SMEAR: HPV, high-risk: NEGATIVE

## 2022-06-09 ENCOUNTER — Other Ambulatory Visit: Payer: Self-pay | Admitting: Family Medicine

## 2022-06-09 ENCOUNTER — Encounter: Payer: Self-pay | Admitting: *Deleted

## 2022-06-09 DIAGNOSIS — I10 Essential (primary) hypertension: Secondary | ICD-10-CM

## 2022-06-23 ENCOUNTER — Encounter: Payer: Self-pay | Admitting: Family Medicine

## 2022-06-23 ENCOUNTER — Ambulatory Visit (INDEPENDENT_AMBULATORY_CARE_PROVIDER_SITE_OTHER): Payer: HMO | Admitting: Family Medicine

## 2022-06-23 VITALS — BP 120/88 | HR 82 | Temp 97.8°F | Resp 18 | Ht 60.0 in | Wt 169.6 lb

## 2022-06-23 DIAGNOSIS — I10 Essential (primary) hypertension: Secondary | ICD-10-CM | POA: Diagnosis not present

## 2022-06-23 DIAGNOSIS — M7062 Trochanteric bursitis, left hip: Secondary | ICD-10-CM | POA: Diagnosis not present

## 2022-06-23 DIAGNOSIS — D229 Melanocytic nevi, unspecified: Secondary | ICD-10-CM

## 2022-06-23 DIAGNOSIS — M25551 Pain in right hip: Secondary | ICD-10-CM | POA: Diagnosis not present

## 2022-06-23 DIAGNOSIS — E785 Hyperlipidemia, unspecified: Secondary | ICD-10-CM

## 2022-06-23 DIAGNOSIS — M7061 Trochanteric bursitis, right hip: Secondary | ICD-10-CM | POA: Diagnosis not present

## 2022-06-23 DIAGNOSIS — M25552 Pain in left hip: Secondary | ICD-10-CM

## 2022-06-23 NOTE — Progress Notes (Signed)
Established Patient Office Visit  Subjective   Patient ID: Susan Torres, female    DOB: October 25, 1955  Age: 67 y.o. MRN: [id number]  Chief Complaint  Patient presents with   Hypertension    Pt states checking blood pressures at home and states they've been good   Follow-up    HPI Discussed the use of AI scribe software for clinical note transcription with the patient, who gave verbal consent to proceed.  History of Present Illness   The patient, with a history of hypertension and arthritis, presents with concerns about their blood pressure and hip pain. They report that their home blood pressure readings have been higher than usual, despite changing the batteries in their machine. They are considering purchasing a new machine, as their current one is a few years old.  In addition, they have been experiencing hip pain, which they attribute to their arthritis. They have been trying Pilates once a week and walking to manage their weight, but are unsure if these activities are exacerbating their hip pain. They have an appointment with their orthopedic specialist later in the day to discuss their hip pain.  They also mention a mole that has been bothering them. They believe it may be getting bigger and it has been irritated by their clothing. They express concern about the mole, but do not want to take any action until after their upcoming beach vacation. They plan to cover the mole with a Band-Aid to prevent further irritation from clothing.  Lastly, they mention that they have a scheduled bone density test in October and a mammogram, but do not provide further details about these tests.      Patient Active Problem List   Diagnosis Date Noted   Atypical mole 06/23/2022   Anxiety 10/01/2020   Hip pain 10/26/2019   Preventative health care 04/30/2016   Osteopenia 02/20/2013   Hypoglycemia 11/06/2011   HAND PAIN, BILATERAL 05/15/2009   DIZZINESS 09/26/2008   NAUSEA AND VOMITING  09/26/2008   Hyperlipidemia 08/05/2007   MYALGIA 08/05/2007   Essential hypertension 03/17/2007   ALLERGIC RHINITIS 03/17/2007   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   Bursitis    Hyperlipidemia    Hypertension    Post-operative nausea and vomiting    Uterine fibroid    Past Surgical History:  Procedure Laterality Date   BREAST CYST EXCISION     Rght Breast/benign   EYE SURGERY  2005   Right Eye   KNEE SURGERY  2002   Bilateral   Social History   Tobacco Use   Smoking status: Never    Passive exposure: Yes   Smokeless tobacco: Never  Vaping Use   Vaping Use: Never used  Substance Use Topics   Alcohol use: Yes    Alcohol/week: 2.0 standard drinks of alcohol    Types: 2 Glasses of wine per week   Drug use: No   Social History   Socioeconomic History   Marital status: Single    Spouse name: Not on file   Number of children: Not on file   Years of education: Not on file   Highest education level: Not on file  Occupational History   Occupation: [employer]  Tobacco Use   Smoking status: Never    Passive exposure: Yes   Smokeless tobacco: Never  Vaping Use   Vaping Use: Never used  Substance and Sexual Activity   Alcohol  use: Yes    Alcohol/week: 2.0 standard drinks of alcohol    Types: 2 Glasses of wine per week   Drug use: No   Sexual activity: Not Currently    Partners: Male  Other Topics Concern   Not on file  Social History Narrative         Exercising ---  walking 2-3 x a week      Pilates    Social Determinants of Health   Financial Resource Strain: Not on file  Food Insecurity: Not on file  Transportation Needs: Not on file  Physical Activity: Not on file  Stress: Not on file  Social Connections: Not on file  Intimate Partner Violence: Not on file   Family Status  Relation Name Status   Father  Deceased   Mother  Alive   Brother  Deceased   Brother  Alive   Family History  Problem  Relation Age of Onset   Stomach cancer Father    Alcohol abuse Father    Hypertension Mother    Diabetes Mother    Prostate cancer Brother    Kidney failure Brother    Alcoholism Brother    Diabetes Brother    No Known Allergies    ROS    Objective:     BP 120/88 (BP Location: Left Arm, Patient Position: Sitting, Cuff Size: Normal)   Pulse 82   Temp 97.8 F (36.6 C) (Oral)   Resp 18   Ht 5' (1.524 m)   Wt 169 lb 9.6 oz (76.9 kg)   SpO2 98%   BMI 33.12 kg/m  BP Readings from Last 3 Encounters:  06/23/22 120/88  01/16/22 (!) 140/100  01/09/21 (!) 140/94   Wt Readings from Last 3 Encounters:  06/23/22 169 lb 9.6 oz (76.9 kg)  01/16/22 166 lb 12.8 oz (75.7 kg)  01/09/21 161 lb 6.4 oz (73.2 kg)   SpO2 Readings from Last 3 Encounters:  06/23/22 98%  01/16/22 94%  01/09/21 97%      Physical Exam Vitals and nursing note reviewed.  Constitutional:      Appearance: She is well-developed.  HENT:     Head: Normocephalic and atraumatic.  Eyes:     Conjunctiva/sclera: Conjunctivae normal.  Neck:     Thyroid: No thyromegaly.     Vascular: No carotid bruit or JVD.  Cardiovascular:     Rate and Rhythm: Normal rate and regular rhythm.     Heart sounds: Normal heart sounds. No murmur heard. Pulmonary:     Effort: Pulmonary effort is normal. No respiratory distress.     Breath sounds: Normal breath sounds. No wheezing or rales.  Chest:     Chest wall: No tenderness.  Musculoskeletal:     Cervical back: Normal range of motion and neck supple.  Skin:    Findings: Lesion present.          Comments: Rough round lesion on Left side waist   Neurological:     General: No focal deficit present.     Mental Status: She is alert and oriented to person, place, and time.  Psychiatric:        Mood and Affect: Mood normal.        Behavior: Behavior normal.        Thought Content: Thought content normal.        Judgment: Judgment normal.      No results found for any  visits on 06/23/22.  Last CBC Lab Results  Component  Value Date   WBC 7.6 01/16/2022   HGB 15.2 (H) 01/16/2022   HCT 45.5 01/16/2022   MCV 85.7 01/16/2022   MCH 29.6 01/09/2021   RDW 14.3 01/16/2022   PLT 344.0 01/16/2022   Last metabolic panel Lab Results  Component Value Date   GLUCOSE 96 01/16/2022   NA 140 01/16/2022   K 3.8 01/16/2022   CL 103 01/16/2022   CO2 28 01/16/2022   BUN 10 01/16/2022   CREATININE 0.69 01/16/2022   GFRNONAA >60 05/24/2017   CALCIUM 9.3 01/16/2022   PROT 7.1 01/16/2022   ALBUMIN 4.6 01/16/2022   BILITOT 0.4 01/16/2022   ALKPHOS 77 01/16/2022   AST 32 01/16/2022   ALT 44 (H) 01/16/2022   ANIONGAP 7 05/24/2017   Last lipids Lab Results  Component Value Date   CHOL 148 01/16/2022   HDL 51.70 01/16/2022   LDLCALC 79 01/16/2022   TRIG 86.0 01/16/2022   CHOLHDL 3 01/16/2022   Last hemoglobin A1c Lab Results  Component Value Date   HGBA1C 6.2 04/13/2022   Last thyroid functions Lab Results  Component Value Date   TSH 1.76 01/16/2022   T4TOTAL 9.8 07/01/2015   Last vitamin D Lab Results  Component Value Date   VD25OH 38.73 01/16/2022   Last vitamin B12 and Folate Lab Results  Component Value Date   VITAMINB12 569 01/16/2022   FOLATE 19.4 09/26/2008      The 10-year ASCVD risk score (Arnett DK, et al., 2019) is: 6.1%    Assessment & Plan:   Problem List Items Addressed This Visit       Unprioritized   Hyperlipidemia - Primary    Lab Results  Component Value Date   CHOL 148 01/16/2022   HDL 51.70 01/16/2022   LDLCALC 79 01/16/2022   TRIG 86.0 01/16/2022   CHOLHDL 3 01/16/2022  Encourage heart healthy diet such as MIND or DASH diet, increase exercise, avoid trans fats, simple carbohydrates and processed foods, consider a krill or fish or flaxseed oil cap daily.         Hip pain    Pt will see ortho later today      Essential hypertension    Well controlled, no changes to meds. Encouraged heart healthy diet  such as the DASH diet and exercise as tolerated.        Atypical mole    Pt will return for punch biopsy after her trip [place]     Assessment and Plan    Hypertension: Home blood pressure readings are higher than in-office readings. Patient's home blood pressure monitor may be inaccurate. -Advise patient to contact the company for a replacement monitor.  Hip Pain: Patient reports hip pain, possibly due to arthritis. Patient is currently participating in Pilates for exercise and is concerned it may be exacerbating the pain. -Encourage continuation of Pilates for core strengthening and flexibility, but advise moderation if pain increases. -Patient has an appointment with orthopedic specialist Dr. Wallace Cullens for further evaluation.  Skin Lesion: Patient reports a skin lesion that may be increasing in size and is irritated by clothing. -Advise patient to cover the lesion with a Band-Aid to prevent irritation from clothing. -Consider dermatology referral for possible biopsy after patient returns from vacation.  General Health Maintenance: -Bone density test scheduled for October 23, 2022. -Mammogram scheduled with Dr. Reina Fuse.        Return in about 6 months (around 12/23/2022), or if symptoms worsen or fail to improve,  for annual exam, fasting.    Donato Schultz, DO

## 2022-06-23 NOTE — Assessment & Plan Note (Signed)
Well controlled, no changes to meds. Encouraged heart healthy diet such as the DASH diet and exercise as tolerated.  °

## 2022-06-23 NOTE — Patient Instructions (Signed)
Hypertension, Adult High blood pressure (hypertension) is when the force of blood pumping through the arteries is too strong. The arteries are the blood vessels that carry blood from the heart throughout the body. Hypertension forces the heart to work harder to pump blood and may cause arteries to become narrow or stiff. Untreated or uncontrolled hypertension can lead to a heart attack, heart failure, a stroke, kidney disease, and other problems. A blood pressure reading consists of a higher number over a lower number. Ideally, your blood pressure should be below 120/80. The first ("top") number is called the systolic pressure. It is a measure of the pressure in your arteries as your heart beats. The second ("bottom") number is called the diastolic pressure. It is a measure of the pressure in your arteries as the heart relaxes. What are the causes? The exact cause of this condition is not known. There are some conditions that result in high blood pressure. What increases the risk? Certain factors may make you more likely to develop high blood pressure. Some of these risk factors are under your control, including: Smoking. Not getting enough exercise or physical activity. Being overweight. Having too much fat, sugar, calories, or salt (sodium) in your diet. Drinking too much alcohol. Other risk factors include: Having a personal history of heart disease, diabetes, high cholesterol, or kidney disease. Stress. Having a family history of high blood pressure and high cholesterol. Having obstructive sleep apnea. Age. The risk increases with age. What are the signs or symptoms? High blood pressure may not cause symptoms. Very high blood pressure (hypertensive crisis) may cause: Headache. Fast or irregular heartbeats (palpitations). Shortness of breath. Nosebleed. Nausea and vomiting. Vision changes. Severe chest pain, dizziness, and seizures. How is this diagnosed? This condition is diagnosed by  measuring your blood pressure while you are seated, with your arm resting on a flat surface, your legs uncrossed, and your feet flat on the floor. The cuff of the blood pressure monitor will be placed directly against the skin of your upper arm at the level of your heart. Blood pressure should be measured at least twice using the same arm. Certain conditions can cause a difference in blood pressure between your right and left arms. If you have a high blood pressure reading during one visit or you have normal blood pressure with other risk factors, you may be asked to: Return on a different day to have your blood pressure checked again. Monitor your blood pressure at home for 1 week or longer. If you are diagnosed with hypertension, you may have other blood or imaging tests to help your health care provider understand your overall risk for other conditions. How is this treated? This condition is treated by making healthy lifestyle changes, such as eating healthy foods, exercising more, and reducing your alcohol intake. You may be referred for counseling on a healthy diet and physical activity. Your health care provider may prescribe medicine if lifestyle changes are not enough to get your blood pressure under control and if: Your systolic blood pressure is above 130. Your diastolic blood pressure is above 80. Your personal target blood pressure may vary depending on your medical conditions, your age, and other factors. Follow these instructions at home: Eating and drinking  Eat a diet that is high in fiber and potassium, and low in sodium, added sugar, and fat. An example of this eating plan is called the DASH diet. DASH stands for Dietary Approaches to Stop Hypertension. To eat this way: Eat   plenty of fresh fruits and vegetables. Try to fill one half of your plate at each meal with fruits and vegetables. Eat whole grains, such as whole-wheat pasta, brown rice, or whole-grain bread. Fill about one  fourth of your plate with whole grains. Eat or drink low-fat dairy products, such as skim milk or low-fat yogurt. Avoid fatty cuts of meat, processed or cured meats, and poultry with skin. Fill about one fourth of your plate with lean proteins, such as fish, chicken without skin, beans, eggs, or tofu. Avoid pre-made and processed foods. These tend to be higher in sodium, added sugar, and fat. Reduce your daily sodium intake. Many people with hypertension should eat less than 1,500 mg of sodium a day. Do not drink alcohol if: Your health care provider tells you not to drink. You are pregnant, may be pregnant, or are planning to become pregnant. If you drink alcohol: Limit how much you have to: 0-1 drink a day for women. 0-2 drinks a day for men. Know how much alcohol is in your drink. In the [place] one drink equals one 12 oz bottle of beer (355 mL), one 5 oz glass of wine (148 mL), or one 1 oz glass of hard liquor (44 mL). Lifestyle  Work with your health care provider to maintain a healthy body weight or to lose weight. Ask what an ideal weight is for you. Get at least 30 minutes of exercise that causes your heart to beat faster (aerobic exercise) most days of the week. Activities may include walking, swimming, or biking. Include exercise to strengthen your muscles (resistance exercise), such as Pilates or lifting weights, as part of your weekly exercise routine. Try to do these types of exercises for 30 minutes at least 3 days a week. Do not use any products that contain nicotine or tobacco. These products include cigarettes, chewing tobacco, and vaping devices, such as e-cigarettes. If you need help quitting, ask your health care provider. Monitor your blood pressure at home as told by your health care provider. Keep all follow-up visits. This is important. Medicines Take over-the-counter and prescription medicines only as told by your health care provider. Follow directions carefully. Blood  pressure medicines must be taken as prescribed. Do not skip doses of blood pressure medicine. Doing this puts you at risk for problems and can make the medicine less effective. Ask your health care provider about side effects or reactions to medicines that you should watch for. Contact a health care provider if you: Think you are having a reaction to a medicine you are taking. Have headaches that keep coming back (recurring). Feel dizzy. Have swelling in your ankles. Have trouble with your vision. Get help right away if you: Develop a severe headache or confusion. Have unusual weakness or numbness. Feel faint. Have severe pain in your chest or abdomen. Vomit repeatedly. Have trouble breathing. These symptoms may be an emergency. Get help right away. Call 911. Do not wait to see if the symptoms will go away. Do not drive yourself to the hospital. Summary Hypertension is when the force of blood pumping through your arteries is too strong. If this condition is not controlled, it may put you at risk for serious complications. Your personal target blood pressure may vary depending on your medical conditions, your age, and other factors. For most people, a normal blood pressure is less than 120/80. Hypertension is treated with lifestyle changes, medicines, or a combination of both. Lifestyle changes include losing weight, eating a healthy,   low-sodium diet, exercising more, and limiting alcohol. This information is not intended to replace advice given to you by your health care provider. Make sure you discuss any questions you have with your health care provider. Document Revised: 10/29/2020 Document Reviewed: 10/29/2020 Elsevier Patient Education  2024 Elsevier Inc.  

## 2022-06-23 NOTE — Assessment & Plan Note (Signed)
Pt will return for punch biopsy after her trip [place]

## 2022-06-23 NOTE — Assessment & Plan Note (Signed)
Pt will see ortho later today

## 2022-06-23 NOTE — Assessment & Plan Note (Signed)
Lab Results  Component Value Date   CHOL 148 01/16/2022   HDL 51.70 01/16/2022   LDLCALC 79 01/16/2022   TRIG 86.0 01/16/2022   CHOLHDL 3 01/16/2022  Encourage heart healthy diet such as MIND or DASH diet, increase exercise, avoid trans fats, simple carbohydrates and processed foods, consider a krill or fish or flaxseed oil cap daily.

## 2022-08-09 ENCOUNTER — Other Ambulatory Visit: Payer: Self-pay | Admitting: Family Medicine

## 2022-08-09 DIAGNOSIS — E785 Hyperlipidemia, unspecified: Secondary | ICD-10-CM

## 2022-09-07 ENCOUNTER — Other Ambulatory Visit: Payer: Self-pay | Admitting: Family Medicine

## 2022-09-07 DIAGNOSIS — I10 Essential (primary) hypertension: Secondary | ICD-10-CM

## 2022-09-09 ENCOUNTER — Other Ambulatory Visit: Payer: Self-pay | Admitting: Family Medicine

## 2022-09-09 DIAGNOSIS — M159 Polyosteoarthritis, unspecified: Secondary | ICD-10-CM

## 2022-09-19 ENCOUNTER — Other Ambulatory Visit: Payer: Self-pay | Admitting: Family Medicine

## 2022-09-19 DIAGNOSIS — M8580 Other specified disorders of bone density and structure, unspecified site: Secondary | ICD-10-CM

## 2022-10-23 ENCOUNTER — Ambulatory Visit
Admission: RE | Admit: 2022-10-23 | Discharge: 2022-10-23 | Disposition: A | Discharge status: Home / Self Care | Payer: HMO | Source: Ambulatory Visit | Attending: Obstetrics and Gynecology

## 2022-10-23 ENCOUNTER — Ambulatory Visit (INDEPENDENT_AMBULATORY_CARE_PROVIDER_SITE_OTHER)
Admission: RE | Admit: 2022-10-23 | Discharge: 2022-10-23 | Disposition: A | Discharge status: Home / Self Care | Payer: HMO | Source: Ambulatory Visit | Attending: Family Medicine | Admitting: Family Medicine

## 2022-10-23 DIAGNOSIS — E2839 Other primary ovarian failure: Secondary | ICD-10-CM

## 2022-10-27 ENCOUNTER — Encounter: Payer: Self-pay | Admitting: Family Medicine

## 2022-10-28 ENCOUNTER — Telehealth: Payer: Self-pay

## 2022-10-28 NOTE — Telephone Encounter (Signed)
Pt has tried and failed Boniva.  Last Dexa results: 10/23/2022  "+osteoporosis---  she needs to start different tx-==prolia /or evenity Take calcium 1200 mg daily with vita d3 2000 daily Weight bearing exercise Recheck 1=2 years She may need to be seen for her hips"  Please check benefits.

## 2022-10-28 NOTE — Telephone Encounter (Signed)
Pt viewed via Mychart and message sent to Team Prolia

## 2022-10-30 ENCOUNTER — Telehealth: Payer: Self-pay

## 2022-10-30 NOTE — Telephone Encounter (Signed)
Created new encounter for Prolia BIV. Will route encounter back once benefit verification is complete.  

## 2022-10-30 NOTE — Telephone Encounter (Signed)
Prolia VOB initiated via [email or web address] 

## 2022-11-06 ENCOUNTER — Other Ambulatory Visit (HOSPITAL_COMMUNITY): Payer: Self-pay

## 2022-11-06 NOTE — Telephone Encounter (Signed)
Pt ready for scheduling for PROLIA on or after : 11/06/22  Out-of-pocket cost due at time of visit: $345  Primary: [employer] Prolia co-insurance: 20% Admin fee co-insurance: 20%  Secondary: --- Prolia co-insurance:  Admin fee co-insurance:   Medical Benefit Details: Date Benefits were checked: 11/02/22 Deductible: NO/ Coinsurance: 20%/ Admin Fee: 20%  Prior Auth: N/A PA# Expiration Date:   # of doses approved:  Pharmacy benefit: Copay $200 If patient wants fill through the pharmacy benefit please send prescription to:  [facility] [phone]) , and include estimated need by date in rx notes. Pharmacy will ship medication directly to the office.  Patient NOT eligible for Prolia Copay Card. Copay Card can make patient's cost as little as $25. Link to apply: https://www.amgensupportplus.com/copay  ** This summary of benefits is an estimation of the patient's out-of-pocket cost. Exact cost may very based on individual plan coverage.

## 2022-11-06 NOTE — Telephone Encounter (Signed)
Holding for now- until PA/Prolia Admin advises on how to move forward. D/T new Protocol rollout.

## 2022-11-10 NOTE — Telephone Encounter (Signed)
Mychart message sent to the pt.

## 2022-11-10 NOTE — Telephone Encounter (Signed)
Would this be the same cost for Evenity?

## 2022-11-13 ENCOUNTER — Other Ambulatory Visit: Payer: Self-pay | Admitting: Family Medicine

## 2022-11-13 DIAGNOSIS — E785 Hyperlipidemia, unspecified: Secondary | ICD-10-CM

## 2022-11-17 ENCOUNTER — Other Ambulatory Visit: Payer: Self-pay | Admitting: Family Medicine

## 2022-11-17 DIAGNOSIS — I10 Essential (primary) hypertension: Secondary | ICD-10-CM

## 2022-11-17 NOTE — Telephone Encounter (Signed)
Evenity VOB initiated via [email or web address]

## 2022-11-18 NOTE — Telephone Encounter (Signed)
Pt called stating that she checked with her insurance and they do not cover Evenity. Pt stated that she would like to start the prolia injections when possible.

## 2022-11-20 ENCOUNTER — Other Ambulatory Visit: Payer: Self-pay

## 2022-11-20 DIAGNOSIS — Z8739 Personal history of other diseases of the musculoskeletal system and connective tissue: Secondary | ICD-10-CM

## 2022-11-20 MED ORDER — DENOSUMAB 60 MG/ML ~~LOC~~ SOSY
60.0000 mg | PREFILLED_SYRINGE | Freq: Once | SUBCUTANEOUS | 0 refills | Status: AC
Start: 2022-11-20 — End: 2022-11-20

## 2022-11-20 NOTE — Telephone Encounter (Signed)
Pt returned CMA call. Advised she is not available. Please call back to discuss.

## 2022-11-20 NOTE — Telephone Encounter (Signed)
Left vm to return call.    

## 2022-11-20 NOTE — Telephone Encounter (Signed)
Pt chooses to get her Prolia billed Pharmacy. Rx has been sent. She is aware that [facility] will call her. She would like to wait until December for apt.

## 2022-11-20 NOTE — Telephone Encounter (Signed)
Pt ready for scheduling for Evenity on or after : 11/20/22  Out-of-pocket cost due at time of visit: $493  Primary: [employer] Prolia co-insurance: 20% Admin fee co-insurance: 20%  Secondary: --- Prolia co-insurance:  Admin fee co-insurance:   Medical Benefit Details: Date Benefits were checked: 11/19/22 Deductible: NO/ Coinsurance: 20%/ Admin Fee: 20%  Prior Auth: N/A PA# Expiration Date:  # of doses approved:   Pharmacy benefit: Copay $--- If patient wants fill through the pharmacy benefit please send prescription to:  --- , and include estimated need by date in rx notes. Pharmacy will ship medication directly to the office.  Patient NOT eligible for Evenity Copay Card. Copay Card can make patient's cost as little as $25. Link to apply: https://www.amgensupportplus.com/copay  ** This summary of benefits is an estimation of the patient's out-of-pocket cost. Exact cost may very based on individual plan coverage.

## 2022-11-23 NOTE — Telephone Encounter (Signed)
See TE thread.

## 2022-11-25 ENCOUNTER — Telehealth: Payer: Self-pay | Admitting: Family Medicine

## 2022-11-25 DIAGNOSIS — M810 Age-related osteoporosis without current pathological fracture: Secondary | ICD-10-CM

## 2022-11-25 NOTE — Telephone Encounter (Signed)
Sullivan Lone with [facility] called to request clarification on the frequency the doctor requires for prolia as written. This can be faxed to them at [phone] or called in at [phone]

## 2022-11-26 NOTE — Telephone Encounter (Signed)
Spoke with [facility]. They are calling Pharmacy Benefits.

## 2022-12-07 NOTE — Telephone Encounter (Signed)
Patient called and would like to know status of Prolia, Please call patient back.

## 2022-12-10 ENCOUNTER — Other Ambulatory Visit (HOSPITAL_COMMUNITY): Payer: Self-pay

## 2022-12-10 ENCOUNTER — Encounter (HOSPITAL_COMMUNITY): Payer: Self-pay

## 2022-12-10 ENCOUNTER — Other Ambulatory Visit: Payer: Self-pay

## 2022-12-10 MED ORDER — DENOSUMAB 60 MG/ML ~~LOC~~ SOSY
60.0000 mg | PREFILLED_SYRINGE | SUBCUTANEOUS | 0 refills | Status: DC
Start: 2022-12-10 — End: 2023-06-01
  Filled 2022-12-10 – 2022-12-11 (×2): qty 1, 180d supply, fill #0

## 2022-12-10 NOTE — Addendum Note (Signed)
Addended by: Thelma Barge D on: 12/10/2022 11:56 AM   Modules accepted: Orders

## 2022-12-10 NOTE — Telephone Encounter (Signed)
Are you able to check on this for me?

## 2022-12-11 ENCOUNTER — Other Ambulatory Visit: Payer: Self-pay

## 2022-12-11 NOTE — Telephone Encounter (Signed)
Susan Torres filled rx and will send over Monday.  Patient scheduled for 12/18/22.

## 2022-12-11 NOTE — Progress Notes (Signed)
Specialty Pharmacy Initial Fill Coordination Note  Susan Torres is a 67 y.o. female contacted today regarding initial fill of specialty medication(s) Denosumab   Patient requested Courier to Provider Office   Delivery date: 12/15/22   Verified address: [facility] [address]  [place] [address]   Medication will be filled on 12/9.   Patient is aware of $200 copayment.

## 2022-12-14 ENCOUNTER — Other Ambulatory Visit: Payer: Self-pay

## 2022-12-15 NOTE — Telephone Encounter (Signed)
Prolia received and placed in Refrigerator.

## 2022-12-18 ENCOUNTER — Ambulatory Visit (INDEPENDENT_AMBULATORY_CARE_PROVIDER_SITE_OTHER): Payer: HMO | Admitting: *Deleted

## 2022-12-18 ENCOUNTER — Telehealth: Payer: Self-pay | Admitting: *Deleted

## 2022-12-18 DIAGNOSIS — M810 Age-related osteoporosis without current pathological fracture: Secondary | ICD-10-CM

## 2022-12-18 MED ORDER — DENOSUMAB 60 MG/ML ~~LOC~~ SOSY
60.0000 mg | PREFILLED_SYRINGE | Freq: Once | SUBCUTANEOUS | Status: AC
Start: 2023-06-19 — End: 2023-06-24
  Administered 2023-06-24: 60 mg via SUBCUTANEOUS

## 2022-12-18 MED ORDER — DENOSUMAB 60 MG/ML ~~LOC~~ SOSY
60.0000 mg | PREFILLED_SYRINGE | Freq: Once | SUBCUTANEOUS | Status: AC
Start: 2022-12-18 — End: 2022-12-18
  Administered 2022-12-18: 60 mg via SUBCUTANEOUS

## 2022-12-18 NOTE — Progress Notes (Signed)
Patient here for prolia injection per physicians orders.  Prolia came from pharmacy.  Injection given left subq and patient tolerated well.

## 2022-12-18 NOTE — Telephone Encounter (Signed)
Prolia given today 12/18/22 Next Prolia due 06/19/22  CAM order placed

## 2023-02-14 ENCOUNTER — Other Ambulatory Visit: Payer: Self-pay | Admitting: Family Medicine

## 2023-02-14 DIAGNOSIS — I10 Essential (primary) hypertension: Secondary | ICD-10-CM

## 2023-02-22 DIAGNOSIS — I10 Essential (primary) hypertension: Secondary | ICD-10-CM | POA: Diagnosis not present

## 2023-02-22 DIAGNOSIS — E785 Hyperlipidemia, unspecified: Secondary | ICD-10-CM | POA: Diagnosis not present

## 2023-02-22 DIAGNOSIS — E669 Obesity, unspecified: Secondary | ICD-10-CM | POA: Diagnosis not present

## 2023-02-22 DIAGNOSIS — M810 Age-related osteoporosis without current pathological fracture: Secondary | ICD-10-CM | POA: Diagnosis not present

## 2023-02-22 DIAGNOSIS — M1990 Unspecified osteoarthritis, unspecified site: Secondary | ICD-10-CM | POA: Diagnosis not present

## 2023-02-22 DIAGNOSIS — G8929 Other chronic pain: Secondary | ICD-10-CM | POA: Diagnosis not present

## 2023-03-10 ENCOUNTER — Telehealth: Payer: Self-pay | Admitting: Family Medicine

## 2023-03-10 NOTE — Telephone Encounter (Signed)
 Copied from CRM [id number]. Topic: Medicare AWV >> Mar 10, 2023  9:50 AM Payton Doughty wrote: Reason for CRM: Called LVM 03/10/2023 to schedule AWV. Please schedule Virtual or Telehealth visits ONLY.   Verlee Rossetti; Care Guide Ambulatory Clinical Support [facility] Direct Dial: [phone]

## 2023-04-10 ENCOUNTER — Other Ambulatory Visit: Payer: Self-pay | Admitting: Family Medicine

## 2023-04-10 DIAGNOSIS — I10 Essential (primary) hypertension: Secondary | ICD-10-CM

## 2023-05-11 DIAGNOSIS — Z1231 Encounter for screening mammogram for malignant neoplasm of breast: Secondary | ICD-10-CM | POA: Diagnosis not present

## 2023-05-11 LAB — HM MAMMOGRAPHY

## 2023-05-18 ENCOUNTER — Telehealth: Payer: Self-pay

## 2023-05-18 NOTE — Telephone Encounter (Signed)
 Prolia  VOB initiated via [email or web address]  Next Prolia  inj DUE: 06/17/23

## 2023-05-20 ENCOUNTER — Other Ambulatory Visit (HOSPITAL_COMMUNITY): Payer: Self-pay

## 2023-05-20 NOTE — Telephone Encounter (Signed)
 Pt ready for scheduling for PROLIA  on or after : 06/17/23  Option# 1: Buy/Bill (Office supplied medication)  Out-of-pocket cost due at time of clinic visit: $332  Number of injection/visits approved: ---  Primary: [employer] Prolia  co-insurance: 20% Admin fee co-insurance: 0%  Secondary: --- Prolia  co-insurance:  Admin fee co-insurance:   Medical Benefit Details: Date Benefits were checked: 05/20/23 Deductible: NO/ Coinsurance: 20%/ Admin Fee: 0%  Prior Auth: N/A PA# Expiration Date:   # of doses approved: ----------------------------------------------------------------------- Option# 2- Med Obtained from pharmacy:  Pharmacy benefit: Copay [phone] (Paid to pharmacy) Admin Fee: 0% (Pay at clinic)  Prior Auth: N/A PA# Expiration Date:   # of doses approved:   If patient wants fill through the pharmacy benefit please send prescription to: [facility] [facility], and include estimated need by date in rx notes. Pharmacy will ship medication directly to the office.  Patient NOT eligible for Prolia  Copay Card. Copay Card can make patient's cost as little as $25. Link to apply: [email or web address]://[email or web address]  ** This summary of benefits is an estimation of the patient's out-of-pocket cost. Exact cost may very based on individual plan coverage.

## 2023-05-25 DIAGNOSIS — H524 Presbyopia: Secondary | ICD-10-CM | POA: Diagnosis not present

## 2023-06-01 ENCOUNTER — Other Ambulatory Visit: Payer: Self-pay

## 2023-06-01 ENCOUNTER — Other Ambulatory Visit: Payer: Self-pay | Admitting: *Deleted

## 2023-06-01 DIAGNOSIS — M810 Age-related osteoporosis without current pathological fracture: Secondary | ICD-10-CM

## 2023-06-01 MED ORDER — DENOSUMAB 60 MG/ML ~~LOC~~ SOSY
60.0000 mg | PREFILLED_SYRINGE | SUBCUTANEOUS | 0 refills | Status: AC
  Filled 2023-06-01: qty 1, 180d supply, fill #0

## 2023-06-10 ENCOUNTER — Other Ambulatory Visit: Payer: Self-pay

## 2023-06-10 NOTE — Progress Notes (Signed)
 Disenrolling - 5.13.25 benefit investigation shows prolia  is more affordable through medical benefit ($332). Pharmacy benefit is [phone], tiffany will follow up with office.

## 2023-06-16 ENCOUNTER — Telehealth: Payer: Self-pay | Admitting: Family Medicine

## 2023-06-16 NOTE — Telephone Encounter (Unsigned)
 Copied from CRM [id number]. Topic: Clinical - Medication Question >> Jun 16, 2023  3:47 PM Clyde Darling P wrote: Reason for CRM: Pt want to make sure she get lowest cost for Prolia  - pt would like to be contacted [phone]

## 2023-06-24 ENCOUNTER — Encounter: Payer: Self-pay | Admitting: Family Medicine

## 2023-06-24 ENCOUNTER — Ambulatory Visit (INDEPENDENT_AMBULATORY_CARE_PROVIDER_SITE_OTHER): Admitting: Family Medicine

## 2023-06-24 VITALS — BP 120/88 | HR 82 | Temp 98.0°F | Resp 18 | Ht 60.0 in | Wt 170.4 lb

## 2023-06-24 DIAGNOSIS — M160 Bilateral primary osteoarthritis of hip: Secondary | ICD-10-CM | POA: Diagnosis not present

## 2023-06-24 DIAGNOSIS — F419 Anxiety disorder, unspecified: Secondary | ICD-10-CM

## 2023-06-24 DIAGNOSIS — I10 Essential (primary) hypertension: Secondary | ICD-10-CM

## 2023-06-24 DIAGNOSIS — E782 Mixed hyperlipidemia: Secondary | ICD-10-CM | POA: Diagnosis not present

## 2023-06-24 DIAGNOSIS — Z0000 Encounter for general adult medical examination without abnormal findings: Secondary | ICD-10-CM

## 2023-06-24 DIAGNOSIS — R079 Chest pain, unspecified: Secondary | ICD-10-CM

## 2023-06-24 DIAGNOSIS — M810 Age-related osteoporosis without current pathological fracture: Secondary | ICD-10-CM | POA: Diagnosis not present

## 2023-06-24 DIAGNOSIS — M150 Primary generalized (osteo)arthritis: Secondary | ICD-10-CM

## 2023-06-24 LAB — VITAMIN D 25 HYDROXY (VIT D DEFICIENCY, FRACTURES): VITD: 58.65 ng/mL (ref 30.00–100.00)

## 2023-06-24 LAB — CBC WITH DIFFERENTIAL/PLATELET
Basophils Absolute: 0.1 10*3/uL (ref 0.0–0.1)
Basophils Relative: 0.6 % (ref 0.0–3.0)
Eosinophils Absolute: 0.4 10*3/uL (ref 0.0–0.7)
Eosinophils Relative: 5.2 % — ABNORMAL HIGH (ref 0.0–5.0)
HCT: 44.5 % (ref 36.0–46.0)
Hemoglobin: 14.7 g/dL (ref 12.0–15.0)
Lymphocytes Relative: 20 % (ref 12.0–46.0)
Lymphs Abs: 1.6 10*3/uL (ref 0.7–4.0)
MCHC: 33.1 g/dL (ref 30.0–36.0)
MCV: 83.8 fl (ref 78.0–100.0)
Monocytes Absolute: 0.4 10*3/uL (ref 0.1–1.0)
Monocytes Relative: 5 % (ref 3.0–12.0)
Neutro Abs: 5.4 10*3/uL (ref 1.4–7.7)
Neutrophils Relative %: 69.2 % (ref 43.0–77.0)
Platelets: 350 10*3/uL (ref 150.0–400.0)
RBC: 5.31 Mil/uL — ABNORMAL HIGH (ref 3.87–5.11)
RDW: 14.1 % (ref 11.5–15.5)
WBC: 7.9 10*3/uL (ref 4.0–10.5)

## 2023-06-24 LAB — COMPREHENSIVE METABOLIC PANEL WITH GFR
ALT: 32 U/L (ref 0–35)
AST: 27 U/L (ref 0–37)
Albumin: 4.6 g/dL (ref 3.5–5.2)
Alkaline Phosphatase: 80 U/L (ref 39–117)
BUN: 9 mg/dL (ref 6–23)
CO2: 29 meq/L (ref 19–32)
Calcium: 9.7 mg/dL (ref 8.4–10.5)
Chloride: 101 meq/L (ref 96–112)
Creatinine, Ser: 0.75 mg/dL (ref 0.40–1.20)
GFR: 83.08 mL/min (ref >60.00)
Glucose, Bld: 94 mg/dL (ref 70–99)
Potassium: 3.7 meq/L (ref 3.5–5.1)
Sodium: 138 meq/L (ref 135–145)
Total Bilirubin: 0.5 mg/dL (ref 0.2–1.2)
Total Protein: 7.3 g/dL (ref 6.0–8.3)

## 2023-06-24 LAB — LIPID PANEL
Cholesterol: 173 mg/dL (ref 0–200)
HDL: 54.2 mg/dL (ref >39.00)
LDL Cholesterol: 95 mg/dL (ref 0–99)
NonHDL: 118.83
Total CHOL/HDL Ratio: 3
Triglycerides: 117 mg/dL (ref 0.0–149.0)
VLDL: 23.4 mg/dL (ref 0.0–40.0)

## 2023-06-24 LAB — VITAMIN B12: Vitamin B-12: 459 pg/mL (ref 211–911)

## 2023-06-24 LAB — TSH: TSH: 2.68 u[IU]/mL (ref 0.35–5.50)

## 2023-06-24 MED ORDER — TRAMADOL HCL 50 MG PO TABS: 50.0000 mg | ORAL_TABLET | Freq: Two times a day (BID) | ORAL | 0 refills | Status: AC | PRN

## 2023-06-24 MED ORDER — PRAVASTATIN SODIUM 40 MG PO TABS: 40.0000 mg | ORAL_TABLET | Freq: Every day | ORAL | 1 refills | Status: AC

## 2023-06-24 MED ORDER — AMLODIPINE BESY-BENAZEPRIL HCL 10-20 MG PO CAPS: 1.0000 | ORAL_CAPSULE | Freq: Every day | ORAL | 0 refills | Status: DC

## 2023-06-24 MED ORDER — DENOSUMAB 60 MG/ML ~~LOC~~ SOSY
60.0000 mg | PREFILLED_SYRINGE | SUBCUTANEOUS | Status: AC
  Administered 2024-01-03: 60 mg via SUBCUTANEOUS

## 2023-06-24 MED ORDER — ESCITALOPRAM OXALATE 10 MG PO TABS: 10.0000 mg | ORAL_TABLET | Freq: Every day | ORAL | 3 refills | Status: AC

## 2023-06-24 MED ORDER — ALPRAZOLAM 0.25 MG PO TABS
0.2500 mg | ORAL_TABLET | Freq: Two times a day (BID) | ORAL | 1 refills | Status: DC | PRN
Start: 2023-06-24 — End: 2023-11-26

## 2023-06-24 NOTE — Progress Notes (Signed)
 Established Patient Office Visit  Subjective   Patient ID: Susan Torres, female    DOB: 1955-01-06  Age: 68 y.o. MRN: [id number]  Chief Complaint  Patient presents with   Annual Exam    Pt states fasting.    HPI Discussed the use of AI scribe software for clinical note transcription with the patient, who gave verbal consent to proceed.  History of Present Illness Susan Torres is a 68 year old female who presents with anxiety and stress related to family caregiving responsibilities.  She experiences significant stress and anxiety due to her family caregiving responsibilities, caring for her 3 year old mother and her brother, who is in a nursing home and preparing for surgery. She describes feelings of 'heaviness' and has a history of a panic attack. No shortness of breath currently.  She has previously been on Lexapro  and Xanax  for anxiety but is not currently taking any anxiety medications. She feels upset when managing her responsibilities.  She is concerned about her own heart health, noting occasional heaviness and arm discomfort. She has not had her heart checked recently. Family history includes a paternal grandfather with heart attack and stroke.  She is currently taking Lotrel for blood pressure, pravastatin , and reports some ankle swelling. She also takes tramadol  for hip pain due to arthritis, cutting the pills in half as needed. She has not refilled her Celebrex  prescription recently as she still has some left.  Her brother, who is 23 or 52 years old, is diabetic and undergoing dialysis. He recently fell and is now in rehab. He is also scheduled for a 10-hour surgery for mouth cancer, which adds to her stress.   Patient Active Problem List   Diagnosis Date Noted   Atypical mole 06/23/2022   Anxiety 10/01/2020   Hip pain 10/26/2019   Preventative health care 04/30/2016   Osteopenia 02/20/2013   Hypoglycemia 11/06/2011   HAND PAIN, BILATERAL 05/15/2009    DIZZINESS 09/26/2008   NAUSEA AND VOMITING 09/26/2008   Hyperlipidemia 08/05/2007   MYALGIA 08/05/2007   Essential hypertension 03/17/2007   ALLERGIC RHINITIS 03/17/2007   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   Bursitis    Hyperlipidemia    Hypertension    Post-operative nausea and vomiting    Uterine fibroid    Past Surgical History:  Procedure Laterality Date   BREAST CYST EXCISION     Rght Breast/benign   EYE SURGERY  2005   Right Eye   KNEE SURGERY  2002   Bilateral   Social History   Tobacco Use   Smoking status: Never    Passive exposure: Yes   Smokeless tobacco: Never  Vaping Use   Vaping status: Never Used  Substance Use Topics   Alcohol use: Yes    Alcohol/week: 2.0 standard drinks of alcohol    Types: 2 Glasses of wine per week   Drug use: No   Social History   Socioeconomic History   Marital status: Single    Spouse name: Not on file   Number of children: Not on file   Years of education: Not on file   Highest education level: Not on file  Occupational History   Occupation: [employer]  Tobacco Use   Smoking status: Never    Passive exposure: Yes   Smokeless tobacco: Never  Vaping Use   Vaping status: Never Used  Substance and Sexual Activity   Alcohol use:  Yes    Alcohol/week: 2.0 standard drinks of alcohol    Types: 2 Glasses of wine per week   Drug use: No   Sexual activity: Not Currently    Partners: Male  Other Topics Concern   Not on file  Social History Narrative         Exercising ---  walking 2-3 x a week      Pilates    Social Drivers of Health   Financial Resource Strain: Medium Risk (06/17/2023)   Overall Financial Resource Strain (CARDIA)    Difficulty of Paying Living Expenses: Somewhat hard  Food Insecurity: No Food Insecurity (06/17/2023)   Hunger Vital Sign    Worried About Running Out of Food in the Last Year: Never true    Ran Out of Food in the Last Year: Never true   Transportation Needs: No Transportation Needs (06/17/2023)   PRAPARE - Administrator, Civil Service (Medical): No    Lack of Transportation (Non-Medical): No  Physical Activity: Insufficiently Active (06/17/2023)   Exercise Vital Sign    Days of Exercise per Week: 3 days    Minutes of Exercise per Session: 30 min  Stress: Stress Concern Present (06/17/2023)   [employer] of Occupational Health - Occupational Stress Questionnaire    Feeling of Stress: Rather much  Social Connections: Unknown (06/17/2023)   Social Connection and Isolation Panel    Frequency of Communication with Friends and Family: Not on file    Frequency of Social Gatherings with Friends and Family: Three times a week    Attends Religious Services: Not on file    Active Member of Clubs or Organizations: Yes    Attends Club or Organization Meetings: Not on file    Marital Status: Never married  Intimate Partner Violence: Unknown (04/07/2021)   Received from [facility]   HITS    Physically Hurt: Not on file    Insult or Talk Down To: Not on file    Threaten Physical Harm: Not on file    Scream or Curse: Not on file   Family Status  Relation Name Status   Mother  Alive   Father  Deceased   Brother  Deceased   Brother  Alive   PGF  (Not Specified)  No partnership data on file   Family History  Problem Relation Age of Onset   Hypertension Mother    Diabetes Mother    Stomach cancer Father    Alcohol abuse Father    Prostate cancer Brother    Kidney failure Brother    Alcoholism Brother    Diabetes Brother    Heart attack Paternal Grandfather    Stroke Paternal Grandfather    No Known Allergies    ROS    Objective:     BP 120/88 (BP Location: Left Arm, Patient Position: Sitting, Cuff Size: Normal)   Pulse 82   Temp 98 F (36.7 C) (Oral)   Resp 18   Ht 5' (1.524 m)   Wt 170 lb 6.4 oz (77.3 kg)   SpO2 98%   BMI 33.28 kg/m  BP Readings from Last 3 Encounters:  06/24/23  120/88  06/23/22 120/88  01/16/22 (!) 140/100   Wt Readings from Last 3 Encounters:  06/24/23 170 lb 6.4 oz (77.3 kg)  06/23/22 169 lb 9.6 oz (76.9 kg)  01/16/22 166 lb 12.8 oz (75.7 kg)   SpO2 Readings from Last 3 Encounters:  06/24/23 98%  06/23/22 98%  01/16/22 94%  Physical Exam   No results found for any visits on 06/24/23.  Last CBC Lab Results  Component Value Date   WBC 7.6 01/16/2022   HGB 15.2 (H) 01/16/2022   HCT 45.5 01/16/2022   MCV 85.7 01/16/2022   MCH 29.6 01/09/2021   RDW 14.3 01/16/2022   PLT 344.0 01/16/2022   Last metabolic panel Lab Results  Component Value Date   GLUCOSE 96 01/16/2022   NA 140 01/16/2022   K 3.8 01/16/2022   CL 103 01/16/2022   CO2 28 01/16/2022   BUN 10 01/16/2022   CREATININE 0.69 01/16/2022   GFR 91.48 01/16/2022   CALCIUM 9.3 01/16/2022   PROT 7.1 01/16/2022   ALBUMIN 4.6 01/16/2022   BILITOT 0.4 01/16/2022   ALKPHOS 77 01/16/2022   AST 32 01/16/2022   ALT 44 (H) 01/16/2022   ANIONGAP 7 05/24/2017   Last lipids Lab Results  Component Value Date   CHOL 148 01/16/2022   HDL 51.70 01/16/2022   LDLCALC 79 01/16/2022   TRIG 86.0 01/16/2022   CHOLHDL 3 01/16/2022   Last hemoglobin A1c Lab Results  Component Value Date   HGBA1C 6.2 04/13/2022   Last thyroid  functions Lab Results  Component Value Date   TSH 1.76 01/16/2022   T4TOTAL 9.8 07/01/2015   Last vitamin D  Lab Results  Component Value Date   VD25OH 38.73 01/16/2022   Last vitamin B12 and Folate Lab Results  Component Value Date   VITAMINB12 569 01/16/2022   FOLATE 19.4 09/26/2008      The 10-year ASCVD risk score (Arnett DK, et al., 2019) is: 6.4%    Assessment & Plan:   Problem List Items Addressed This Visit       Unprioritized   Anxiety   Relevant Medications   ALPRAZolam  (XANAX ) 0.25 MG tablet   escitalopram  (LEXAPRO ) 10 MG tablet   Hyperlipidemia   Relevant Medications   amLODipine -benazepril  (LOTREL) 10-20 MG  capsule   pravastatin  (PRAVACHOL ) 40 MG tablet   Other Relevant Orders   Comprehensive metabolic panel with GFR   Lipid panel   Preventative health care - Primary   Ghm utd Check labs  See AVS Health Maintenance  Topic Date Due   Medicare Annual Wellness (AWV)  Never done   Pneumococcal Vaccine: 50+ Years (1 of 1 - PCV) Never done   MAMMOGRAM  01/28/2023   COVID-19 Vaccine (8 - 2024-25 season) 04/12/2023   INFLUENZA VACCINE  08/06/2023   Colonoscopy  08/30/2028   DTaP/Tdap/Td (5 - Td or Tdap) 09/25/2028   DEXA SCAN  Completed   Hepatitis C Screening  Completed   Zoster Vaccines- Shingrix   Completed   HPV VACCINES  Aged Out   Meningococcal B Vaccine  Aged Out         Other Visit Diagnoses       Osteoporosis, unspecified osteoporosis type, unspecified pathological fracture presence       Relevant Medications   denosumab  (PROLIA ) injection 60 mg (Start on 12/21/2023 12:00 AM)   Other Relevant Orders   VITAMIN D  25 Hydroxy (Vit-D Deficiency, Fractures)     Hypertension, unspecified type       Relevant Medications   amLODipine -benazepril  (LOTREL) 10-20 MG capsule   pravastatin  (PRAVACHOL ) 40 MG tablet   Other Relevant Orders   CBC with Differential/Platelet   Comprehensive metabolic panel with GFR   Lipid panel   TSH   VITAMIN D  25 Hydroxy (Vit-D Deficiency, Fractures)   Vitamin B12     Primary  osteoarthritis involving multiple joints       Relevant Medications   traMADol  (ULTRAM ) 50 MG tablet     Primary osteoarthritis of both hips       Relevant Medications   traMADol  (ULTRAM ) 50 MG tablet     Chest pain, unspecified type       Relevant Orders   EKG 12-Lead (Completed)      Assessment and Plan Assessment & Plan Anxiety   She experiences anxiety due to family stressors, including caring for her elderly mother and her brother undergoing surgery in a nursing home. She has a history of panic attacks and a sense of heaviness but denies shortness of breath.  Although hesitant to start anxiety medication, she is open to using Xanax  as needed and Lexapro  daily. Xanax  will manage acute anxiety symptoms, while Lexapro , a non-addictive option, will be used for daily management, taking 2 weeks to 2 months to become effective. Prescribe Xanax  for as-needed use and Lexapro  for daily use. Perform an EKG to rule out cardiac issues related to reported heaviness and arm symptoms.  Hip Arthritis   She has hip arthritis and previously used tramadol  for pain management. Her knees are not currently problematic. Tramadol  is effective but requires monitoring due to its status as a controlled substance. Prescribe tramadol  for hip arthritis pain management and discuss the need for a urine drug screen and a contract.  Hypertension   Her blood pressure is well-controlled on Lotrel, which contains amlodipine , known to cause ankle swelling. She is advised to elevate her legs and use compression stockings to manage swelling. No immediate change in medication is necessary unless swelling worsens. Continue the current blood pressure medication regimen, advise on leg elevation and compression stockings, and monitor for worsening swelling, considering medication changes if necessary.  General Health Maintenance   She is up to date with eye exams and dental check-ups, had a mammogram last year, and follows a biennial Pap smear schedule as advised by her gynecologist. Continue regular eye and dental check-ups, schedule a mammogram as needed, and follow up with her gynecologist for Pap smears as per the recommended schedule.  Follow-up   She is managing multiple health issues and family stressors. Labs and an EKG are planned to assess her current health status. Perform labs today and follow up as needed based on lab results and EKG findings.   Return in about 6 months (around 12/24/2023), or if symptoms worsen or fail to improve.    Kerin Cecchi R Lowne Chase, DO

## 2023-06-24 NOTE — Assessment & Plan Note (Signed)
 Ghm utd Check labs  See AVS Health Maintenance  Topic Date Due   Medicare Annual Wellness (AWV)  Never done   Pneumococcal Vaccine: 50+ Years (1 of 1 - PCV) Never done   MAMMOGRAM  01/28/2023   COVID-19 Vaccine (8 - 2024-25 season) 04/12/2023   INFLUENZA VACCINE  08/06/2023   Colonoscopy  08/30/2028   DTaP/Tdap/Td (5 - Td or Tdap) 09/25/2028   DEXA SCAN  Completed   Hepatitis C Screening  Completed   Zoster Vaccines- Shingrix   Completed   HPV VACCINES  Aged Out   Meningococcal B Vaccine  Aged Out

## 2023-06-26 ENCOUNTER — Ambulatory Visit: Payer: Self-pay | Admitting: Family Medicine

## 2023-07-08 ENCOUNTER — Encounter: Payer: Self-pay | Admitting: Family Medicine

## 2023-09-22 ENCOUNTER — Other Ambulatory Visit: Payer: Self-pay | Admitting: Family Medicine

## 2023-09-22 DIAGNOSIS — I10 Essential (primary) hypertension: Secondary | ICD-10-CM

## 2023-10-22 ENCOUNTER — Ambulatory Visit: Payer: Self-pay

## 2023-10-22 NOTE — Telephone Encounter (Signed)
 Pt going to [place]

## 2023-10-22 NOTE — Telephone Encounter (Signed)
 FYI Only or Action Required?: FYI only for provider.  Patient was last seen in primary care on 06/24/2023 by Antonio Meth, Jamee SAUNDERS, DO.  Called Nurse Triage reporting Abdominal Pain and Diarrhea.  Symptoms began a week ago.  Interventions attempted: OTC medications: immodium.  Symptoms are: unchanged.  Triage Disposition: See PCP When Office is Open (Within 3 Days)  Patient/caregiver understands and will follow disposition?: Yes  Copied from CRM #[id number]. Topic: Clinical - Red Word Triage >> Oct 22, 2023  9:57 AM [name] wrote: Red Word that prompted transfer to Nurse Triage: abdominal pain, Uncontrolled diarrhea, mouth is very dry, Reason for Disposition  [1] MILD diarrhea (e.g., 1-3 or more stools than normal in past 24 hours) AND [2] present >  7 days  (Exception: Chronic diarrhea that is not worse.)  Answer Assessment - Initial Assessment Questions No available appts today. Advised UC today and ED if symptoms worsen. Patient reports going to UC at [name].  1. DIARRHEA SEVERITY: How bad is the diarrhea? How many more stools have you had in the past 24 hours than normal?      4 times 2. ONSET: When did the diarrhea begin?      Week ago 3. STOOL DESCRIPTION:  How loose or watery is the diarrhea? What is the stool color? Is there any blood or mucous in the stool?     Light brown, loose stool, denies blood, possibly mucous 4. VOMITING: Are you also vomiting? If Yes, ask: How many times in the past 24 hours?      no 5. ABDOMEN PAIN: Are you having any abdomen pain? If Yes, ask: What does it feel like? (e.g., crampy, dull, intermittent, constant)      Only prior to BM 6. ABDOMEN PAIN SEVERITY: If present, ask: How bad is the pain?  (e.g., Scale 1-10; mild, moderate, or severe)     0/10 7. ORAL INTAKE: If vomiting, Have you been able to drink liquids? How much liquids have you had in the past 24 hours?     Unsure, drinking fluids 8. HYDRATION: Any signs  of dehydration? (e.g., dry mouth [not just dry lips], too weak to stand, dizziness, new weight loss) When did you last urinate?     Mouth feels dry, not dizzy or weak, able to eat and drink, urinating, able walk and stand without dizziness 9. EXPOSURE: Have you traveled to a foreign country recently? Have you been exposed to anyone with diarrhea? Could you have eaten any food that was spoiled?     Ate food last weekend and diarrhea started 1 day later 10. ANTIBIOTIC USE: Are you taking antibiotics now or have you taken antibiotics in the past 2 months?       no 11. OTHER SYMPTOMS: Do you have any other symptoms? (e.g., fever, blood in stool)       Denies  fever, n/v, chills Immodium, last taken 4AM; helpful with med  Protocols used: [facility]

## 2023-11-24 ENCOUNTER — Telehealth: Payer: Self-pay

## 2023-11-24 NOTE — Telephone Encounter (Signed)
 Prolia  VOB initiated via [email or web address]  Next Prolia  inj DUE: 12/24/23

## 2023-11-25 ENCOUNTER — Other Ambulatory Visit (HOSPITAL_COMMUNITY): Payer: Self-pay

## 2023-11-25 NOTE — Telephone Encounter (Signed)
 SABRA

## 2023-11-25 NOTE — Telephone Encounter (Signed)
 Pt ready for scheduling for PROLIA  on or after : 12/24/23  Option# 1: Buy/Bill (Office supplied medication)  Out-of-pocket cost due at time of clinic visit: $357  Number of injection/visits approved: ---  Primary: [employer] Prolia  co-insurance: 20% Admin fee co-insurance: 20%  Secondary: --- Prolia  co-insurance:  Admin fee co-insurance:   Medical Benefit Details: Date Benefits were checked: 11/25/23 Deductible: NO/ Coinsurance: 20%/ Admin Fee: 20%  Prior Auth: N/A PA# Expiration Date:   # of doses approved: ----------------------------------------------------------------------- Option# 2- Med Obtained from pharmacy: Prolia  is no longer preferred for pharmacy benefit. Jubbonti is now preferred. PRICING IS FOR JUBBONTI  Pharmacy benefit: Copay $594.04 (Paid to pharmacy) Admin Fee: 20% (Pay at clinic)  Prior Auth: N/A PA# Expiration Date:   # of doses approved:   If patient wants fill through the pharmacy benefit please send prescription to: [facility], and include estimated need by date in rx notes. Pharmacy will ship medication directly to the office.  Patient NOT eligible for Prolia  Copay Card. Copay Card can make patient's cost as little as $25. Link to apply: [email or web address]://[email or web address]  ** This summary of benefits is an estimation of the patient's out-of-pocket cost. Exact cost may very based on individual plan coverage.

## 2023-11-26 ENCOUNTER — Other Ambulatory Visit: Payer: Self-pay | Admitting: Family Medicine

## 2023-11-26 DIAGNOSIS — F419 Anxiety disorder, unspecified: Secondary | ICD-10-CM

## 2023-11-26 NOTE — Telephone Encounter (Signed)
 Requesting: Xanax  Contract: n/a UDS: n/a Last OV: 06/24/2023 Next OV: AWV 03/09/2024 Last Refill: 06/24/2023 Database:   Please advise

## 2023-12-28 ENCOUNTER — Ambulatory Visit

## 2024-01-03 ENCOUNTER — Ambulatory Visit (INDEPENDENT_AMBULATORY_CARE_PROVIDER_SITE_OTHER): Admitting: Family Medicine

## 2024-01-03 ENCOUNTER — Encounter: Payer: Self-pay | Admitting: Family Medicine

## 2024-01-03 VITALS — BP 138/88 | HR 83 | Temp 98.2°F | Resp 16 | Ht 60.0 in | Wt 166.2 lb

## 2024-01-03 DIAGNOSIS — E538 Deficiency of other specified B group vitamins: Secondary | ICD-10-CM

## 2024-01-03 DIAGNOSIS — I10 Essential (primary) hypertension: Secondary | ICD-10-CM | POA: Diagnosis not present

## 2024-01-03 DIAGNOSIS — Z23 Encounter for immunization: Secondary | ICD-10-CM | POA: Diagnosis not present

## 2024-01-03 DIAGNOSIS — M810 Age-related osteoporosis without current pathological fracture: Secondary | ICD-10-CM | POA: Diagnosis not present

## 2024-01-03 DIAGNOSIS — E782 Mixed hyperlipidemia: Secondary | ICD-10-CM

## 2024-01-03 DIAGNOSIS — E559 Vitamin D deficiency, unspecified: Secondary | ICD-10-CM

## 2024-01-03 MED ORDER — DENOSUMAB 60 MG/ML ~~LOC~~ SOSY: 60.0000 mg | PREFILLED_SYRINGE | SUBCUTANEOUS | Status: AC

## 2024-01-03 NOTE — Progress Notes (Signed)
 "  Subjective:    Patient ID: Susan Torres, female    DOB: [birth year], 68 y.o.   MRN: [id number]  Chief Complaint  Patient presents with   Hypertension   Hyperlipidemia   Follow-up    HPI Patient is in today for f/u chol, bp.  Discussed the use of AI scribe software for clinical note transcription with the patient, who gave verbal consent to proceed.  History of Present Illness Susan Torres is a 68 year old female who presents for a follow-up visit and to receive a pneumonia vaccination.  She received her Prolia  injection today, which is either her second or third dose, without any discomfort.  A couple of months ago, she experienced intestinal issues after traveling and consuming different foods, leading to constipation. She was taking various remedies at the time, but her bowel movements have since returned to normal.  She is taking vitamin D  and B12 supplements.  She is concerned about her energy levels, attributing the decrease to a lack of exercise while caring for her brother, who has cancer, and her 65 year old mother. She feels better and has lost four pounds since resuming her exercise routine.  No unusual swelling in her legs, only the usual amount of swelling.    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   Bursitis    Hyperlipidemia    Hypertension    Post-operative nausea and vomiting    Uterine fibroid     Past Surgical History:  Procedure Laterality Date   BREAST CYST EXCISION     Rght Breast/benign   EYE SURGERY  2005   Right Eye   KNEE SURGERY  2002   Bilateral    Family History  Problem Relation Age of Onset   Hypertension Mother    Diabetes Mother    Stomach cancer Father    Alcohol abuse Father    Prostate cancer Brother    Kidney failure Brother    Alcoholism Brother    Diabetes Brother    Heart attack Paternal Grandfather    Stroke Paternal Grandfather     Social History    Socioeconomic History   Marital status: Single    Spouse name: Not on file   Number of children: Not on file   Years of education: Not on file   Highest education level: Not on file  Occupational History   Occupation: [employer]  Tobacco Use   Smoking status: Never    Passive exposure: Yes   Smokeless tobacco: Never  Vaping Use   Vaping status: Never Used  Substance and Sexual Activity   Alcohol use: Yes    Alcohol/week: 2.0 standard drinks of alcohol    Types: 2 Glasses of wine per week   Drug use: No   Sexual activity: Not Currently    Partners: Male  Other Topics Concern   Not on file  Social History Narrative         Exercising ---  walking 2-3 x a week      [place]    Social Drivers of Health   Tobacco Use: Medium Risk (01/03/2024)   Patient History    Smoking Tobacco Use: Never    Smokeless Tobacco Use: Never    Passive Exposure: Yes  Financial Resource Strain: Medium Risk (06/17/2023)   Overall Financial Resource Strain (CARDIA)    Difficulty of Paying Living Expenses: Somewhat hard  Food Insecurity: No Food Insecurity (  06/17/2023)   Epic    Worried About Programme Researcher, Broadcasting/film/video in the Last Year: Never true    Ran Out of Food in the Last Year: Never true  Transportation Needs: No Transportation Needs (06/17/2023)   Epic    Lack of Transportation (Medical): No    Lack of Transportation (Non-Medical): No  Physical Activity: Insufficiently Active (06/17/2023)   Exercise Vital Sign    Days of Exercise per Week: 3 days    Minutes of Exercise per Session: 30 min  Stress: Stress Concern Present (06/17/2023)   [employer] of Occupational Health - Occupational Stress Questionnaire    Feeling of Stress: Rather much  Social Connections: Unknown (06/17/2023)   Social Connection and Isolation Panel    Frequency of Communication with Friends and Family: Not on file    Frequency of Social Gatherings with Friends and Family: Three times a week    Attends Religious  Services: Not on file    Active Member of Clubs or Organizations: Yes    Attends Banker Meetings: Not on file    Marital Status: Never married  Intimate Partner Violence: Not on file  Depression (PHQ2-9): Low Risk (01/03/2024)   Depression (PHQ2-9)    PHQ-2 Score: 0  Alcohol Screen: Not on file  Housing: Unknown (06/17/2023)   Epic    Unable to Pay for Housing in the Last Year: No    Number of Times Moved in the Last Year: Not on file    Homeless in the Last Year: No  Utilities: Not on file  Health Literacy: Not on file    Outpatient Medications Prior to Visit  Medication Sig Dispense Refill   ALPRAZolam  (XANAX ) 0.25 MG tablet TAKE 1 TABLET BY MOUTH 2 TIMES DAILY AS NEEDED FOR ANXIETY. 30 tablet 1   amLODipine -benazepril  (LOTREL) 10-20 MG capsule Take 1 capsule by mouth daily. 90 capsule 1   aspirin EC 81 MG tablet Take 81 mg by mouth daily.     b complex vitamins tablet Take 1 tablet by mouth daily.     Calcium Carbonate-Vit D-Min (CALCIUM 1200 PO) Take by mouth daily.     celecoxib  (CELEBREX ) 200 MG capsule TAKE 1 CAPSULE BY MOUTH TWICE A DAY 180 capsule 2   cetirizine  (ZYRTEC ) 10 MG tablet Take 1 tablet (10 mg total) by mouth daily. 90 tablet 2   Cholecalciferol (VITAMIN D3) 50 MCG (2000 UT) TABS Take by mouth. Take 3 daily     denosumab  (PROLIA ) 60 MG/ML SOSY injection Inject 60 mg into the skin every 6 (six) months. Dx code: M81.0. Pt has appt 06/24/23 1 mL 0   diclofenac  Sodium (VOLTAREN ) 1 % GEL Apply 4 g topically 4 (four) times daily. 100 g 0   escitalopram  (LEXAPRO ) 10 MG tablet Take 1 tablet (10 mg total) by mouth daily. 90 tablet 3   fluticasone (FLONASE) 50 MCG/ACT nasal spray Place 1 spray into both nostrils as needed for allergies or rhinitis.     Green Tea 150 MG CAPS Take by mouth every other day.     Multiple Vitamin (MULTIVITAMIN) tablet Take 1 tablet by mouth daily.     pravastatin  (PRAVACHOL ) 40 MG tablet Take 1 tablet (40 mg total) by mouth daily.  90 tablet 1   traMADol  (ULTRAM ) 50 MG tablet Take 1 tablet (50 mg total) by mouth every 12 (twelve) hours as needed. 30 tablet 0   Turmeric 450 MG CAPS Take by mouth daily.     Facility-Administered  Medications Prior to Visit  Medication Dose Route Frequency Provider Last Rate Last Admin   [COMPLETED] denosumab  (PROLIA ) injection 60 mg  60 mg Subcutaneous Q6 months Antonio Meth, Hershey Knauer R, DO   60 mg at 01/03/24 1538    Allergies[1]  Review of Systems  Constitutional:  Negative for chills, fever and malaise/fatigue.  HENT:  Negative for congestion and hearing loss.   Eyes:  Negative for blurred vision and discharge.  Respiratory:  Negative for cough, sputum production and shortness of breath.   Cardiovascular:  Negative for chest pain, palpitations and leg swelling.  Gastrointestinal:  Negative for abdominal pain, blood in stool, constipation, diarrhea, heartburn, nausea and vomiting.  Genitourinary:  Negative for dysuria, frequency, hematuria and urgency.  Musculoskeletal:  Negative for back pain, falls and myalgias.  Skin:  Negative for rash.  Neurological:  Negative for dizziness, sensory change, loss of consciousness, weakness and headaches.  Endo/Heme/Allergies:  Negative for environmental allergies. Does not bruise/bleed easily.  Psychiatric/Behavioral:  Negative for depression and suicidal ideas. The patient is not nervous/anxious and does not have insomnia.        Objective:    Physical Exam Vitals and nursing note reviewed.  Constitutional:      General: She is not in acute distress.    Appearance: Normal appearance. She is well-developed.  HENT:     Head: Normocephalic and atraumatic.  Eyes:     General: No scleral icterus.       Right eye: No discharge.        Left eye: No discharge.  Cardiovascular:     Rate and Rhythm: Normal rate and regular rhythm.     Heart sounds: No murmur heard. Pulmonary:     Effort: Pulmonary effort is normal. No respiratory distress.      Breath sounds: Normal breath sounds.  Musculoskeletal:        General: Normal range of motion.     Cervical back: Normal range of motion and neck supple.     Right lower leg: No edema.     Left lower leg: No edema.  Skin:    General: Skin is warm and dry.  Neurological:     Mental Status: She is alert and oriented to person, place, and time.  Psychiatric:        Mood and Affect: Mood normal.        Behavior: Behavior normal.        Thought Content: Thought content normal.        Judgment: Judgment normal.     BP 138/88 (BP Location: Left Arm, Patient Position: Sitting, Cuff Size: Large)   Pulse 83   Temp 98.2 F (36.8 C) (Oral)   Resp 16   Ht 5' (1.524 m)   Wt 166 lb 3.2 oz (75.4 kg)   SpO2 97%   BMI 32.46 kg/m  Wt Readings from Last 3 Encounters:  01/03/24 166 lb 3.2 oz (75.4 kg)  06/24/23 170 lb 6.4 oz (77.3 kg)  06/23/22 169 lb 9.6 oz (76.9 kg)    Diabetic Foot Exam - Simple   No data filed    Lab Results  Component Value Date   WBC 7.9 06/24/2023   HGB 14.7 06/24/2023   HCT 44.5 06/24/2023   PLT 350.0 06/24/2023   GLUCOSE 94 06/24/2023   CHOL 173 06/24/2023   TRIG 117.0 06/24/2023   HDL 54.20 06/24/2023   LDLCALC 95 06/24/2023   ALT 32 06/24/2023   AST 27 06/24/2023  NA 138 06/24/2023   K 3.7 06/24/2023   CL 101 06/24/2023   CREATININE 0.75 06/24/2023   BUN 9 06/24/2023   CO2 29 06/24/2023   TSH 2.68 06/24/2023   HGBA1C 6.2 04/13/2022    Lab Results  Component Value Date   TSH 2.68 06/24/2023   Lab Results  Component Value Date   WBC 7.9 06/24/2023   HGB 14.7 06/24/2023   HCT 44.5 06/24/2023   MCV 83.8 06/24/2023   PLT 350.0 06/24/2023   Lab Results  Component Value Date   NA 138 06/24/2023   K 3.7 06/24/2023   CO2 29 06/24/2023   GLUCOSE 94 06/24/2023   BUN 9 06/24/2023   CREATININE 0.75 06/24/2023   BILITOT 0.5 06/24/2023   ALKPHOS 80 06/24/2023   AST 27 06/24/2023   ALT 32 06/24/2023   PROT 7.3 06/24/2023   ALBUMIN 4.6  06/24/2023   CALCIUM 9.7 06/24/2023   ANIONGAP 7 05/24/2017   GFR 83.08 06/24/2023   Lab Results  Component Value Date   CHOL 173 06/24/2023   Lab Results  Component Value Date   HDL 54.20 06/24/2023   Lab Results  Component Value Date   LDLCALC 95 06/24/2023   Lab Results  Component Value Date   TRIG 117.0 06/24/2023   Lab Results  Component Value Date   CHOLHDL 3 06/24/2023   Lab Results  Component Value Date   HGBA1C 6.2 04/13/2022       Assessment & Plan:  Hypertension, unspecified type -     CBC with Differential/Platelet -     Comprehensive metabolic panel with GFR -     Lipid panel -     TSH  Osteoporosis, unspecified osteoporosis type, unspecified pathological fracture presence -     Denosumab   Mixed hyperlipidemia -     CBC with Differential/Platelet -     Comprehensive metabolic panel with GFR -     Lipid panel  Vitamin B 12 deficiency -     Vitamin B12  Vitamin D  deficiency -     VITAMIN D  25 Hydroxy (Vit-D Deficiency, Fractures)  Need for pneumococcal 20-valent conjugate vaccination -     Pneumococcal conjugate vaccine 20-valent  Assessment and Plan Assessment & Plan Adult Wellness Visit   Adult wellness visit with ongoing health concerns discussed, including osteoporosis, vitamin B12 deficiency, and vitamin D  deficiency. Energy levels have improved with regular exercise, and she has lost four pounds since June. No significant leg swelling noted. Administered pneumonia vaccine and performed laboratory tests.  Osteoporosis   Managed with Prolia  injections. She received her second or third injection today without adverse effects.  Vitamin B12 deficiency   Supplementation discussed. No current symptoms related to B12 deficiency reported.  Vitamin D  deficiency   Supplementation discussed. No current symptoms related to vitamin D  deficiency reported.    Tellis Spivak R Lowne Chase, DO     [1] No Known Allergies  "

## 2024-01-04 LAB — COMPREHENSIVE METABOLIC PANEL WITH GFR
ALT: 20 U/L (ref 3–35)
AST: 20 U/L (ref 5–37)
Albumin: 4.5 g/dL (ref 3.5–5.2)
Alkaline Phosphatase: 70 U/L (ref 39–117)
BUN: 14 mg/dL (ref 6–23)
CO2: 29 meq/L (ref 19–32)
Calcium: 9.6 mg/dL (ref 8.4–10.5)
Chloride: 103 meq/L (ref 96–112)
Creatinine, Ser: 0.79 mg/dL (ref 0.40–1.20)
GFR: 77.77 mL/min
Glucose, Bld: 81 mg/dL (ref 70–99)
Potassium: 3.8 meq/L (ref 3.5–5.1)
Sodium: 142 meq/L (ref 135–145)
Total Bilirubin: 0.4 mg/dL (ref 0.2–1.2)
Total Protein: 7.2 g/dL (ref 6.0–8.3)

## 2024-01-04 LAB — CBC WITH DIFFERENTIAL/PLATELET
Basophils Absolute: 0.1 K/uL (ref 0.0–0.1)
Basophils Relative: 1.2 % (ref 0.0–3.0)
Eosinophils Absolute: 0.4 K/uL (ref 0.0–0.7)
Eosinophils Relative: 4.5 % (ref 0.0–5.0)
HCT: 42.8 % (ref 36.0–46.0)
Hemoglobin: 14.4 g/dL (ref 12.0–15.0)
Lymphocytes Relative: 20.9 % (ref 12.0–46.0)
Lymphs Abs: 1.8 K/uL (ref 0.7–4.0)
MCHC: 33.6 g/dL (ref 30.0–36.0)
MCV: 84.4 fl (ref 78.0–100.0)
Monocytes Absolute: 0.5 K/uL (ref 0.1–1.0)
Monocytes Relative: 5.9 % (ref 3.0–12.0)
Neutro Abs: 5.7 K/uL (ref 1.4–7.7)
Neutrophils Relative %: 67.5 % (ref 43.0–77.0)
Platelets: 325 K/uL (ref 150.0–400.0)
RBC: 5.07 Mil/uL (ref 3.87–5.11)
RDW: 14.9 % (ref 11.5–15.5)
WBC: 8.5 K/uL (ref 4.0–10.5)

## 2024-01-04 LAB — LIPID PANEL
Cholesterol: 172 mg/dL (ref 28–200)
HDL: 59.8 mg/dL
LDL Cholesterol: 84 mg/dL (ref 10–99)
NonHDL: 112.34
Total CHOL/HDL Ratio: 3
Triglycerides: 142 mg/dL (ref 10.0–149.0)
VLDL: 28.4 mg/dL (ref 0.0–40.0)

## 2024-01-04 LAB — TSH: TSH: 1.72 u[IU]/mL (ref 0.35–5.50)

## 2024-01-04 LAB — VITAMIN D 25 HYDROXY (VIT D DEFICIENCY, FRACTURES): VITD: 59.4 ng/mL (ref 30.00–100.00)

## 2024-01-04 LAB — VITAMIN B12: Vitamin B-12: 501 pg/mL (ref 211–911)

## 2024-01-06 ENCOUNTER — Ambulatory Visit: Payer: Self-pay | Admitting: Family Medicine

## 2024-03-09 ENCOUNTER — Ambulatory Visit
